# Patient Record
Sex: Male | Born: 1955 | Race: White | Hispanic: Yes | Marital: Married
Health system: Southern US, Community
[De-identification: ages and names within clinical notes are randomized; demographics above are authoritative.]

## PROBLEM LIST (undated history)

## (undated) DIAGNOSIS — J9621 Acute and chronic respiratory failure with hypoxia: Secondary | ICD-10-CM

## (undated) DIAGNOSIS — I6381 Other cerebral infarction due to occlusion or stenosis of small artery: Secondary | ICD-10-CM

## (undated) DIAGNOSIS — I482 Chronic atrial fibrillation, unspecified: Secondary | ICD-10-CM

## (undated) DIAGNOSIS — K7030 Alcoholic cirrhosis of liver without ascites: Secondary | ICD-10-CM

## (undated) DIAGNOSIS — I63219 Cerebral infarction due to unspecified occlusion or stenosis of unspecified vertebral arteries: Secondary | ICD-10-CM

---

## 2021-01-07 ENCOUNTER — Institutional Professional Consult (permissible substitution)
Admission: RE | Admit: 2021-01-07 | Discharge: 2021-02-03 | Disposition: A | Discharge status: Skilled Nursing Facility | Payer: Medicare Other | Attending: Internal Medicine | Admitting: Internal Medicine

## 2021-01-07 DIAGNOSIS — K567 Ileus, unspecified: Secondary | ICD-10-CM

## 2021-01-07 DIAGNOSIS — I482 Chronic atrial fibrillation, unspecified: Secondary | ICD-10-CM | POA: Diagnosis present

## 2021-01-07 DIAGNOSIS — J9690 Respiratory failure, unspecified, unspecified whether with hypoxia or hypercapnia: Secondary | ICD-10-CM

## 2021-01-07 DIAGNOSIS — I6322 Cerebral infarction due to unspecified occlusion or stenosis of basilar arteries: Secondary | ICD-10-CM | POA: Diagnosis present

## 2021-01-07 DIAGNOSIS — K7030 Alcoholic cirrhosis of liver without ascites: Secondary | ICD-10-CM | POA: Diagnosis present

## 2021-01-07 DIAGNOSIS — J9621 Acute and chronic respiratory failure with hypoxia: Secondary | ICD-10-CM | POA: Diagnosis present

## 2021-01-07 DIAGNOSIS — Z931 Gastrostomy status: Secondary | ICD-10-CM

## 2021-01-07 DIAGNOSIS — I6381 Other cerebral infarction due to occlusion or stenosis of small artery: Secondary | ICD-10-CM | POA: Diagnosis present

## 2021-01-07 DIAGNOSIS — J189 Pneumonia, unspecified organism: Secondary | ICD-10-CM

## 2021-01-07 DIAGNOSIS — R52 Pain, unspecified: Secondary | ICD-10-CM

## 2021-01-07 DIAGNOSIS — K668 Other specified disorders of peritoneum: Secondary | ICD-10-CM

## 2021-01-07 DIAGNOSIS — I63219 Cerebral infarction due to unspecified occlusion or stenosis of unspecified vertebral arteries: Secondary | ICD-10-CM | POA: Diagnosis present

## 2021-01-07 DIAGNOSIS — J939 Pneumothorax, unspecified: Secondary | ICD-10-CM

## 2021-01-07 HISTORY — DX: Alcoholic cirrhosis of liver without ascites: K70.30

## 2021-01-07 HISTORY — DX: Chronic atrial fibrillation, unspecified: I48.20

## 2021-01-07 HISTORY — DX: Cerebral infarction due to unspecified occlusion or stenosis of unspecified vertebral artery: I63.219

## 2021-01-07 HISTORY — DX: Other cerebral infarction due to occlusion or stenosis of small artery: I63.81

## 2021-01-07 HISTORY — DX: Acute and chronic respiratory failure with hypoxia: J96.21

## 2021-01-07 LAB — BLOOD GAS, ARTERIAL
Acid-Base Excess: 1 mmol/L (ref 0.0–2.0)
Bicarbonate: 24.8 mmol/L (ref 20.0–28.0)
FIO2: 30
O2 Saturation: 98.6 %
Patient temperature: 37.3
pCO2 arterial: 38.7 mmHg (ref 32.0–48.0)
pH, Arterial: 7.425 (ref 7.350–7.450)
pO2, Arterial: 128 mmHg — ABNORMAL HIGH (ref 83.0–108.0)

## 2021-01-08 ENCOUNTER — Other Ambulatory Visit (HOSPITAL_COMMUNITY): Payer: Medicare Other

## 2021-01-08 LAB — CBC
HCT: 32.1 % — ABNORMAL LOW (ref 39.0–52.0)
Hemoglobin: 11.3 g/dL — ABNORMAL LOW (ref 13.0–17.0)
MCH: 32.6 pg (ref 26.0–34.0)
MCHC: 35.2 g/dL (ref 30.0–36.0)
MCV: 92.5 fL (ref 80.0–100.0)
Platelets: 589 10*3/uL — ABNORMAL HIGH (ref 150–400)
RBC: 3.47 MIL/uL — ABNORMAL LOW (ref 4.22–5.81)
RDW: 12.2 % (ref 11.5–15.5)
WBC: 12.9 10*3/uL — ABNORMAL HIGH (ref 4.0–10.5)
nRBC: 0 % (ref 0.0–0.2)

## 2021-01-08 LAB — BASIC METABOLIC PANEL
Anion gap: 11 (ref 5–15)
BUN: 12 mg/dL (ref 8–23)
CO2: 23 mmol/L (ref 22–32)
Calcium: 8.5 mg/dL — ABNORMAL LOW (ref 8.9–10.3)
Chloride: 104 mmol/L (ref 98–111)
Creatinine, Ser: 0.64 mg/dL (ref 0.61–1.24)
GFR, Estimated: >60 mL/min (ref >60)
Glucose, Bld: 113 mg/dL — ABNORMAL HIGH (ref 70–99)
Potassium: 3.6 mmol/L (ref 3.5–5.1)
Sodium: 138 mmol/L (ref 135–145)

## 2021-01-08 MED ORDER — IOHEXOL 300 MG/ML  SOLN
50.0000 mL | Freq: Once | INTRAMUSCULAR | Status: AC | PRN
  Administered 2021-01-08: 50 mL

## 2021-01-09 ENCOUNTER — Other Ambulatory Visit (HOSPITAL_COMMUNITY): Payer: Medicare Other

## 2021-01-10 ENCOUNTER — Encounter: Payer: Self-pay | Admitting: Internal Medicine

## 2021-01-10 ENCOUNTER — Other Ambulatory Visit (HOSPITAL_COMMUNITY): Payer: Medicare Other

## 2021-01-10 DIAGNOSIS — K7030 Alcoholic cirrhosis of liver without ascites: Secondary | ICD-10-CM | POA: Diagnosis not present

## 2021-01-10 DIAGNOSIS — I482 Chronic atrial fibrillation, unspecified: Secondary | ICD-10-CM | POA: Diagnosis not present

## 2021-01-10 DIAGNOSIS — J9621 Acute and chronic respiratory failure with hypoxia: Secondary | ICD-10-CM | POA: Diagnosis present

## 2021-01-10 DIAGNOSIS — I63219 Cerebral infarction due to unspecified occlusion or stenosis of unspecified vertebral arteries: Secondary | ICD-10-CM

## 2021-01-10 DIAGNOSIS — I6381 Other cerebral infarction due to occlusion or stenosis of small artery: Secondary | ICD-10-CM | POA: Diagnosis present

## 2021-01-10 DIAGNOSIS — I6322 Cerebral infarction due to unspecified occlusion or stenosis of basilar arteries: Secondary | ICD-10-CM

## 2021-01-10 NOTE — Progress Notes (Signed)
Pulmonary Critical Care Medicine [facility] [place]   PULMONARY CRITICAL CARE SERVICE  PROGRESS NOTE  Date of Service: 01/10/2021  Paul Chung  ZGY:[id number]  DOB: 03-Sep-1956   DOA: 01/07/2021  Referring Physician: Carron Curie, MD  HPI: Paul Chung is a 65 y.o. male seen for follow up of Acute on Chronic Respiratory Failure.  Patient comfortable right now without distress has been on the weaning protocol  Medications: Reviewed on Rounds  Physical Exam:  Vitals: Temperature is 98.7 pulse 110 respiratory rate 25 blood pressure 134/66 saturations 100%  Ventilator Settings on pressure support waen  . General: Comfortable at this time . Eyes: Grossly normal lids, irises & conjunctiva . ENT: grossly tongue is normal . Neck: no obvious mass . Cardiovascular: S1 S2 normal no gallop . Respiratory: Scattered rhonchi coarse breath sound . Abdomen: soft . Skin: no rash seen on limited exam . Musculoskeletal: not rigid . Psychiatric:unable to assess . Neurologic: no seizure no involuntary movements         Lab Data:   Basic Metabolic Panel: Recent Labs  Lab 01/08/21 0438  NA 138  K 3.6  CL 104  CO2 23  GLUCOSE 113*  BUN 12  CREATININE 0.64  CALCIUM 8.5*    ABG: Recent Labs  Lab 01/07/21 2145  PHART 7.425  PCO2ART 38.7  PO2ART 128*  HCO3 24.8  O2SAT 98.6    Liver Function Tests: No results for input(s): AST, ALT, ALKPHOS, BILITOT, PROT, ALBUMIN in the last 168 hours. No results for input(s): LIPASE, AMYLASE in the last 168 hours. No results for input(s): AMMONIA in the last 168 hours.  CBC: Recent Labs  Lab 01/08/21 0438  WBC 12.9*  HGB 11.3*  HCT 32.1*  MCV 92.5  PLT 589*    Cardiac Enzymes: No results for input(s): CKTOTAL, CKMB, CKMBINDEX, TROPONINI in the last 168 hours.  BNP (last 3 results) No results for input(s): BNP in the last 8760 hours.  ProBNP (last 3 results) No results for input(s): PROBNP in  the last 8760 hours.  Radiological Exams: DG CHEST PORT 1 VIEW  Result Date: 01/10/2021 CLINICAL DATA:  Respiratory failure.  Pneumoperitoneum. EXAM: PORTABLE CHEST 1 VIEW COMPARISON:  None. FINDINGS: There is a tracheostomy tube in place. There are small bilateral pleural effusions. There is cardiomegaly with atherosclerotic changes of the thoracic aorta. There is no pneumothorax. There are old healed right-sided rib fractures. There is an old healed right clavicle fracture. IMPRESSION: 1. Small bilateral pleural effusions. 2. Cardiomegaly with atherosclerotic changes of the thoracic aorta. Electronically Signed   By: Katherine Mantle M.D.   On: 01/10/2021 06:48   DG Abd Decub  Result Date: 01/09/2021 CLINICAL DATA:  Evaluate known moderate free peritoneal air on recent CT scan 01/08/2021. Recent PEG tube placement 01/05/2021. EXAM: ABDOMEN - 1 VIEW DECUBITUS COMPARISON:  01/08/2021 and CT 01/08/2021 FINDINGS: Again noted is moderate free peritoneal air under the mid and right diaphragm. Moderate air distended stomach. Peg tube positioned over the stomach in the left upper quadrant. Bowel gas pattern demonstrates air throughout the colon. There are several air-filled nondilated small bowel loops. There are a few scattered air-fluid levels. Remainder of the exam is unchanged. IMPRESSION: 1. Nonobstructive bowel gas pattern with a few scattered air-fluid levels. 2. Moderate free peritoneal air unchanged from yesterday's CT 01/08/2021 and may be due to patient's recent PEG tube placement, although underlying bowel injury is still possible. 3. Percutaneous gastrostomy tube over the stomach in the left  upper quadrant. Moderate air distended stomach. Electronically Signed   By: Elberta Fortis M.D.   On: 01/09/2021 08:28   DG Abd Portable 1V  Result Date: 01/10/2021 CLINICAL DATA:  Pneumoperitoneum. EXAM: PORTABLE ABDOMEN - 1 VIEW COMPARISON:  January 09, 2021 FINDINGS: There is persistent pneumoperitoneum  in the right quadrant. The stomach is significantly distended. Again noted is a peg tube projecting over the stomach. The bowel gas pattern is nonobstructive. IMPRESSION: 1. Persistent pneumoperitoneum. 2. Distended stomach. Electronically Signed   By: Katherine Mantle M.D.   On: 01/10/2021 06:49    Assessment/Plan Active Problems:   Acute on chronic respiratory failure with hypoxia (HCC)   Acute arterial ischemic stroke, vertebrobasilar, thalamic, unspecified laterality (HCC)   Chronic atrial fibrillation with RVR (HCC)   Alcoholic cirrhosis of liver without ascites (HCC)   1. Acute on chronic respiratory failure hypoxia we will continue with the wean protocol as tolerated continue secretion management supportive care. 2. Acute stroke supportive care medical management with physical therapy 3. Chronic atrial fibrillation has been on anticoagulation no active bleed 4. Alcohol cirrhosis supportive care monitor cognitive status ammonia levels as needed   I have personally seen and evaluated the patient, evaluated laboratory and imaging results, formulated the assessment and plan and placed orders. The Patient requires high complexity decision making with multiple systems involvement.  Rounds were done with the Respiratory Therapy Director and Staff therapists and discussed with nursing staff also.  Yevonne Pax, MD [facility] Pulmonary Critical Care Medicine Sleep Medicine

## 2021-01-10 NOTE — Consult Note (Signed)
Pulmonary Critical Care Medicine [facility] [place]  PULMONARY SERVICE  Date of Service: 01/08/2021  PULMONARY CRITICAL CARE CONSULT   Paul Chung  SJG:[id number]  DOB: 06/28/1956   DOA: 01/07/2021  Referring Physician: Carron Curie, MD  HPI: Paul Chung is a 65 y.o. male seen for follow up of Acute on Chronic Respiratory Failure.  Patient has multiple medical problems including chronic atrial fibrillation diabetes mellitus hypertension alcoholic cirrhosis pancreatitis who presented to the hospital because of altered mental status.  Patient had been initially found to be unresponsive and found to be in rapid atrial fibrillation.  Patient was brought to the emergency department he was unresponsive intubated basically for airway protection CT scan initially that was done showed subtle hypodensity in the left thalamus extending into the midbrain.  Hospital course patient continued to have episodes of atrial fibrillation with rapid ventricular response and also had minimal response to.  By the 31st patient apparently was nodding to command and withdrawing extremities to pain.  January 3 to its noted that he was opening eyes but not following commands.  Less responsive at this time.  The patient was not extubated because of mental status.  By January 5 patient was extubated on high flow nasal cannula and by January 6 was not able to protect the airway so ended up having to be reintubated.  Because of the reintubation patient ended up having to have a tracheostomy done on January 10 patient also had a PEG tube placement on.  The patient was then started on T collar weans and subsequently transferred to our facility for further management  Review of Systems:  ROS performed and is unremarkable other than noted above.  Past Medical History:  Diagnosis Date  . Acute pancreatitis 07/03/2016  . Arrhythmia  . Atrial fibrillation (CMS/HCC) (HCC) 07/03/2016  . Coagulopathy  (CMS/HCC) (HCC) 07/03/2016  . Diabetes mellitus (CMS/HCC) (HCC) 07/03/2016  . Esophagitis  . Hypertension  . 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 Liver disorder  . Sepsis (CMS/HCC) 07/03/2016    Past Surgical History:  Procedure Laterality Date  . APPENDECTOMY  . HERNIA REPAIR    Family History  Problem Relation Age of Onset  . No Known Problems Mother  . No Known Problems Father  . Diabetes Sister  . No Known Problems Brother  . No Known Problems Brother  . No Known Problems Brother    Social History   Tobacco Use  . Smoking status: Former Games developer  . Smokeless tobacco: Never Used  Substance and Sexual Activity  . Alcohol use: No  Comment: past drinker for 40 years  . Drug use: No  . Sexual activity: Not on file       Medications: Reviewed on Rounds  Physical Exam:  Vitals: Temperature 99.6 pulse 117 respiratory 23 blood pressure is 1 3466 saturations 100%  Ventilator Settings on pressure support FiO2 30% pressure 12/5  . General: Comfortable at this time . Eyes: Grossly normal lids, irises & conjunctiva . ENT: grossly tongue is normal . Neck: no obvious mass . Cardiovascular: S1-S2 normal irregular rhythm . Respiratory: Scattered rhonchi expansion is equal . Abdomen: Soft and nontender . Skin: no rash seen on limited exam . Musculoskeletal: not rigid . Psychiatric:unable to assess . Neurologic: no seizure no involuntary movements         Labs on Admission:  Basic Metabolic Panel: Recent Labs  Lab 01/08/21 0438  NA 138  K 3.6  CL 104  CO2 23  GLUCOSE 113*  BUN 12  CREATININE 0.64  CALCIUM 8.5*    Recent Labs  Lab 01/07/21 2145  PHART 7.425  PCO2ART 38.7  PO2ART 128*  HCO3 24.8  O2SAT 98.6    Liver Function Tests: No results for input(s): AST, ALT, ALKPHOS, BILITOT, PROT, ALBUMIN in the last 168 hours. No results for input(s): LIPASE, AMYLASE in the last 168 hours. No results for input(s): AMMONIA in the last 168  hours.  CBC: Recent Labs  Lab 01/08/21 0438  WBC 12.9*  HGB 11.3*  HCT 32.1*  MCV 92.5  PLT 589*    Cardiac Enzymes: No results for input(s): CKTOTAL, CKMB, CKMBINDEX, TROPONINI in the last 168 hours.  BNP (last 3 results) No results for input(s): BNP in the last 8760 hours.  ProBNP (last 3 results) No results for input(s): PROBNP in the last 8760 hours.   Radiological Exams on Admission: CT ABDOMEN WO CONTRAST  Addendum Date: 01/08/2021   ADDENDUM REPORT: 01/08/2021 06:37 ADDENDUM: Study discussed by telephone with Dr. Brita Romp on 01/08/2021 at 06:36. She advised that the peg tube placement was 4 days ago, and that the patient is currently nontoxic. Therefore, favor this degree of pneumoperitoneum is postprocedural. We discussed follow-up with decubitus abdominal radiographs in the next day or two to document expected resolution of free air. Electronically Signed   By: Odessa Fleming M.D.   On: 01/08/2021 06:37   Result Date: 01/08/2021 CLINICAL DATA:  65 year old male with peg tube placement, questionable pneumoperitoneum on supine radiographs 0307 hours today. EXAM: CT ABDOMEN WITHOUT CONTRAST TECHNIQUE: Multidetector CT imaging of the abdomen was performed following the standard protocol without IV contrast. COMPARISON:  Radiographs 0307 hours today. FINDINGS: Lower chest: Mild cardiomegaly. Trace bilateral layering pleural effusions. Minor lung base atelectasis. Hepatobiliary: Moderate volume pneumoperitoneum in the upper abdomen, primarily along the liver contour and also at the superior cardiophrenic angle. Underlying nodular liver contour consistent with cirrhosis. No discrete liver lesion on this noncontrast exam. Contracted gallbladder. Pancreas: Negative noncontrast pancreas. Spleen: Upper limits of normal for size. Adrenals/Urinary Tract: Negative noncontrast adrenal glands, right kidney. Punctate right upper pole nephrolithiasis. Stomach/Bowel: Moderate volume of pneumoperitoneum,  confluent in the right upper quadrant and under the ventral abdominal wall, scattered in the left abdominal mesentery. Percutaneous gastrostomy tube in place (series 3, image 28) with moderately distended gas and contrast containing stomach. No gastric wall thickening or perigastric inflammatory stranding. The duodenum and proximal jejunum appear normal. Oral contrast is present in the mid jejunum. The transverse colon is located posterior and inferior to the stomach well away from the PEG tube site. No convincing large bowel wall thickening. Vascular/Lymphatic: Normal caliber abdominal aorta. Mild Aortoiliac calcified atherosclerosis. Vascular patency is not evaluated in the absence of IV contrast. No upper abdominal lymphadenopathy. Other: Trace if any abdominal free fluid, such as along the right hepatic contour. Musculoskeletal: No acute osseous abnormality identified. IMPRESSION: 1. Moderate volume free air in the abdomen. This could be an expected finding if the PEG tube was placed very recently. But should NOT be associated with a chronically indwelling gastrostomy tube - in which case tube malfunction would be favored over an acute bowel perforation. 2. Stomach is distended with air and some contrast. Administered oral contrast has reached the mid small bowel with no contrast extravasation visible. No evidence of mechanical bowel obstruction, no bowel inflammation identified. 3. Cirrhotic Liver. Cardiomegaly. Trace pleural effusions. Mild Calcified aortic atherosclerosis. Electronically Signed: By: Althea Grimmer.D.  On: 01/08/2021 05:53   DG ABDOMEN PEG TUBE LOCATION  Result Date: 01/08/2021 CLINICAL DATA:  Peg tube placement EXAM: ABDOMEN - 1 VIEW COMPARISON:  None FINDINGS: Injection of contrast through the pre-existing feeding tube opacifies the stomach. There is no extraluminal contrast. There is a questionable Rigler's sign involving multiple small bowel loops in the left lower quadrant. IMPRESSION: 1.  Injection of contrast opacifies the stomach. 2. Questionable pneumoperitoneum. Repeat radiographs with upright and cross-table lateral views are recommended. These results will be called to the ordering clinician or representative by the Radiologist Assistant, and communication documented in the PACS or [employer] Dashboard. Electronically Signed   By: Katherine Mantlehristopher  Green M.D.   On: 01/08/2021 03:27   DG CHEST PORT 1 VIEW  Result Date: 01/10/2021 CLINICAL DATA:  Respiratory failure.  Pneumoperitoneum. EXAM: PORTABLE CHEST 1 VIEW COMPARISON:  None. FINDINGS: There is a tracheostomy tube in place. There are small bilateral pleural effusions. There is cardiomegaly with atherosclerotic changes of the thoracic aorta. There is no pneumothorax. There are old healed right-sided rib fractures. There is an old healed right clavicle fracture. IMPRESSION: 1. Small bilateral pleural effusions. 2. Cardiomegaly with atherosclerotic changes of the thoracic aorta. Electronically Signed   By: Katherine Mantlehristopher  Green M.D.   On: 01/10/2021 06:48   DG Abd Decub  Result Date: 01/09/2021 CLINICAL DATA:  Evaluate known moderate free peritoneal air on recent CT scan 01/08/2021. Recent PEG tube placement 01/05/2021. EXAM: ABDOMEN - 1 VIEW DECUBITUS COMPARISON:  01/08/2021 and CT 01/08/2021 FINDINGS: Again noted is moderate free peritoneal air under the mid and right diaphragm. Moderate air distended stomach. Peg tube positioned over the stomach in the left upper quadrant. Bowel gas pattern demonstrates air throughout the colon. There are several air-filled nondilated small bowel loops. There are a few scattered air-fluid levels. Remainder of the exam is unchanged. IMPRESSION: 1. Nonobstructive bowel gas pattern with a few scattered air-fluid levels. 2. Moderate free peritoneal air unchanged from yesterday's CT 01/08/2021 and may be due to patient's recent PEG tube placement, although underlying bowel injury is still possible. 3. Percutaneous  gastrostomy tube over the stomach in the left upper quadrant. Moderate air distended stomach. Electronically Signed   By: Elberta Fortisaniel  Boyle M.D.   On: 01/09/2021 08:28   DG Abd Portable 1V  Result Date: 01/10/2021 CLINICAL DATA:  Pneumoperitoneum. EXAM: PORTABLE ABDOMEN - 1 VIEW COMPARISON:  January 09, 2021 FINDINGS: There is persistent pneumoperitoneum in the right quadrant. The stomach is significantly distended. Again noted is a peg tube projecting over the stomach. The bowel gas pattern is nonobstructive. IMPRESSION: 1. Persistent pneumoperitoneum. 2. Distended stomach. Electronically Signed   By: Katherine Mantlehristopher  Green M.D.   On: 01/10/2021 06:49    Assessment/Plan Active Problems:   Acute on chronic respiratory failure with hypoxia (HCC)   Acute arterial ischemic stroke, vertebrobasilar, thalamic, unspecified laterality (HCC)   Chronic atrial fibrillation with RVR (HCC)   Alcoholic cirrhosis of liver without ascites (HCC)   1. Acute on chronic respiratory failure with hypoxia patient will continue with weaning on pressure support as tolerated right now is on 12/5.  Based on the notes from the other facility it appears the patient was on T collar trial so we should be able to move towards that however on 13 January as noted the patient apparently failed because of intermittent episodes of apnea.  Stroke appeared to affect bilateral thalamus 2. Acute ischemic stroke felt to be secondary to the atrial fibrillation the patient mental status is waxing  and waning based on the notes that were able to see on discharge summary. 3. Chronic atrial fibrillation rapid ventricular response patient has been on Eliquis Plavix aspirin we will continue to monitor closely.  Patient has been on amiodarone for the atrial fibrillation. 4. Alcoholic cirrhosis we will continue to monitor patient will need ammonia is followed also.  Overall patient's prognosis remains guarded.  I have personally seen and evaluated the  patient, evaluated laboratory and imaging results, formulated the assessment and plan and placed orders. The Patient requires high complexity decision making with multiple systems involvement.  Case was discussed on Rounds with the Respiratory Therapy Director and the Respiratory staff Time Spent  Yevonne Pax, MD [facility] Pulmonary Critical Care Medicine Sleep Medicine

## 2021-01-11 DIAGNOSIS — I63219 Cerebral infarction due to unspecified occlusion or stenosis of unspecified vertebral arteries: Secondary | ICD-10-CM | POA: Diagnosis not present

## 2021-01-11 DIAGNOSIS — K7030 Alcoholic cirrhosis of liver without ascites: Secondary | ICD-10-CM | POA: Diagnosis not present

## 2021-01-11 DIAGNOSIS — I482 Chronic atrial fibrillation, unspecified: Secondary | ICD-10-CM | POA: Diagnosis not present

## 2021-01-11 DIAGNOSIS — J9621 Acute and chronic respiratory failure with hypoxia: Secondary | ICD-10-CM | POA: Diagnosis not present

## 2021-01-11 LAB — CBC
HCT: 31 % — ABNORMAL LOW (ref 39.0–52.0)
Hemoglobin: 11.1 g/dL — ABNORMAL LOW (ref 13.0–17.0)
MCH: 32.9 pg (ref 26.0–34.0)
MCHC: 35.8 g/dL (ref 30.0–36.0)
MCV: 92 fL (ref 80.0–100.0)
Platelets: 589 10*3/uL — ABNORMAL HIGH (ref 150–400)
RBC: 3.37 MIL/uL — ABNORMAL LOW (ref 4.22–5.81)
RDW: 12.3 % (ref 11.5–15.5)
WBC: 12.9 10*3/uL — ABNORMAL HIGH (ref 4.0–10.5)
nRBC: 0 % (ref 0.0–0.2)

## 2021-01-11 NOTE — Progress Notes (Signed)
Pulmonary Critical Care Medicine [facility] [place]   PULMONARY CRITICAL CARE SERVICE  PROGRESS NOTE  Date of Service: 01/11/2021  Paul Chung  [id number]  DOB: Feb 24, 1956   DOA: 01/07/2021  Referring Physician: Carron Curie, MD  HPI: Paul Chung is a 65 y.o. male seen for follow up of Acute on Chronic Respiratory Failure.  Patient currently is on pressure support has been on 28% FiO2 currently on a pressure of 12/5  Medications: Reviewed on Rounds  Physical Exam:  Vitals: Temperature is 98.7 pulse 69 respiratory rate 16 blood pressure 130/74 saturations 99%  Ventilator Settings on pressure support FiO2 28% pressure 12/5  . General: Comfortable at this time . Eyes: Grossly normal lids, irises & conjunctiva . ENT: grossly tongue is normal . Neck: no obvious mass . Cardiovascular: S1 S2 normal no gallop . Respiratory: No rhonchi very coarse breath sound . Abdomen: soft . Skin: no rash seen on limited exam . Musculoskeletal: not rigid . Psychiatric:unable to assess . Neurologic: no seizure no involuntary movements         Lab Data:   Basic Metabolic Panel: Recent Labs  Lab 01/08/21 0438  NA 138  K 3.6  CL 104  CO2 23  GLUCOSE 113*  BUN 12  CREATININE 0.64  CALCIUM 8.5*    ABG: Recent Labs  Lab 01/07/21 2145  PHART 7.425  PCO2ART 38.7  PO2ART 128*  HCO3 24.8  O2SAT 98.6    Liver Function Tests: No results for input(s): AST, ALT, ALKPHOS, BILITOT, PROT, ALBUMIN in the last 168 hours. No results for input(s): LIPASE, AMYLASE in the last 168 hours. No results for input(s): AMMONIA in the last 168 hours.  CBC: Recent Labs  Lab 01/08/21 0438 01/11/21 0453  WBC 12.9* 12.9*  HGB 11.3* 11.1*  HCT 32.1* 31.0*  MCV 92.5 92.0  PLT 589* 589*    Cardiac Enzymes: No results for input(s): CKTOTAL, CKMB, CKMBINDEX, TROPONINI in the last 168 hours.  BNP (last 3 results) No results for input(s): BNP in the last 8760  hours.  ProBNP (last 3 results) No results for input(s): PROBNP in the last 8760 hours.  Radiological Exams: DG CHEST PORT 1 VIEW  Result Date: 01/10/2021 CLINICAL DATA:  Respiratory failure.  Pneumoperitoneum. EXAM: PORTABLE CHEST 1 VIEW COMPARISON:  None. FINDINGS: There is a tracheostomy tube in place. There are small bilateral pleural effusions. There is cardiomegaly with atherosclerotic changes of the thoracic aorta. There is no pneumothorax. There are old healed right-sided rib fractures. There is an old healed right clavicle fracture. IMPRESSION: 1. Small bilateral pleural effusions. 2. Cardiomegaly with atherosclerotic changes of the thoracic aorta. Electronically Signed   By: Katherine Mantle M.D.   On: 01/10/2021 06:48   DG Abd Portable 1V  Result Date: 01/10/2021 CLINICAL DATA:  Pneumoperitoneum. EXAM: PORTABLE ABDOMEN - 1 VIEW COMPARISON:  January 09, 2021 FINDINGS: There is persistent pneumoperitoneum in the right quadrant. The stomach is significantly distended. Again noted is a peg tube projecting over the stomach. The bowel gas pattern is nonobstructive. IMPRESSION: 1. Persistent pneumoperitoneum. 2. Distended stomach. Electronically Signed   By: Katherine Mantle M.D.   On: 01/10/2021 06:49    Assessment/Plan Active Problems:   Acute on chronic respiratory failure with hypoxia (HCC)   Acute arterial ischemic stroke, vertebrobasilar, thalamic, unspecified laterality (HCC)   Chronic atrial fibrillation with RVR (HCC)   Alcoholic cirrhosis of liver without ascites (HCC)   1. Acute on chronic respiratory failure with hypoxia we  will continue with pressure support wean as tolerated. 2. Acute stroke no change supportive care 3. Chronic atrial fibrillation rate is controlled 4. Alcoholic cirrhosis we will continue with present management patient still had persistent cellular peritoneum   I have personally seen and evaluated the patient, evaluated laboratory and imaging  results, formulated the assessment and plan and placed orders. The Patient requires high complexity decision making with multiple systems involvement.  Rounds were done with the Respiratory Therapy Director and Staff therapists and discussed with nursing staff also.  Yevonne Pax, MD [facility] Pulmonary Critical Care Medicine Sleep Medicine

## 2021-01-12 DIAGNOSIS — K7030 Alcoholic cirrhosis of liver without ascites: Secondary | ICD-10-CM | POA: Diagnosis not present

## 2021-01-12 DIAGNOSIS — I63219 Cerebral infarction due to unspecified occlusion or stenosis of unspecified vertebral arteries: Secondary | ICD-10-CM | POA: Diagnosis not present

## 2021-01-12 DIAGNOSIS — I482 Chronic atrial fibrillation, unspecified: Secondary | ICD-10-CM | POA: Diagnosis not present

## 2021-01-12 DIAGNOSIS — J9621 Acute and chronic respiratory failure with hypoxia: Secondary | ICD-10-CM | POA: Diagnosis not present

## 2021-01-12 NOTE — Progress Notes (Signed)
Pulmonary Critical Care Medicine [facility] [place]   PULMONARY CRITICAL CARE SERVICE  PROGRESS NOTE  Date of Service: 01/12/2021  Paul Chung  [id number]  DOB: 04/06/1956   DOA: 01/07/2021  Referring Physician: Carron Curie, MD  HPI: Paul Chung is a 65 y.o. male seen for follow up of Acute on Chronic Respiratory Failure.  Patient currently is on T collar 28% FiO2  Medications: Reviewed on Rounds  Physical Exam:  Vitals: Temperature is 96.4 pulse 65 respiratory 18 blood pressure is 113/65 saturations 100%  Ventilator Settings on T collar with an FiO2 28%  . General: Comfortable at this time . Eyes: Grossly normal lids, irises & conjunctiva . ENT: grossly tongue is normal . Neck: no obvious mass . Cardiovascular: S1 S2 normal no gallop . Respiratory: No rhonchi coarse breath sounds . Abdomen: soft . Skin: no rash seen on limited exam . Musculoskeletal: not rigid . Psychiatric:unable to assess . Neurologic: no seizure no involuntary movements         Lab Data:   Basic Metabolic Panel: Recent Labs  Lab 01/08/21 0438  NA 138  K 3.6  CL 104  CO2 23  GLUCOSE 113*  BUN 12  CREATININE 0.64  CALCIUM 8.5*    ABG: Recent Labs  Lab 01/07/21 2145  PHART 7.425  PCO2ART 38.7  PO2ART 128*  HCO3 24.8  O2SAT 98.6    Liver Function Tests: No results for input(s): AST, ALT, ALKPHOS, BILITOT, PROT, ALBUMIN in the last 168 hours. No results for input(s): LIPASE, AMYLASE in the last 168 hours. No results for input(s): AMMONIA in the last 168 hours.  CBC: Recent Labs  Lab 01/08/21 0438 01/11/21 0453  WBC 12.9* 12.9*  HGB 11.3* 11.1*  HCT 32.1* 31.0*  MCV 92.5 92.0  PLT 589* 589*    Cardiac Enzymes: No results for input(s): CKTOTAL, CKMB, CKMBINDEX, TROPONINI in the last 168 hours.  BNP (last 3 results) No results for input(s): BNP in the last 8760 hours.  ProBNP (last 3 results) No results for input(s): PROBNP in  the last 8760 hours.  Radiological Exams: No results found.  Assessment/Plan Active Problems:   Acute on chronic respiratory failure with hypoxia (HCC)   Acute arterial ischemic stroke, vertebrobasilar, thalamic, unspecified laterality (HCC)   Chronic atrial fibrillation with RVR (HCC)   Alcoholic cirrhosis of liver without ascites (HCC)   1. Acute on chronic respiratory failure with hypoxia we will continue with T collar goal of 4 hours 2. Acute stroke no change we will continue to follow 3. Chronic atrial fibrillation rate is controlled 4. Alcoholic cirrhosis no change   I have personally seen and evaluated the patient, evaluated laboratory and imaging results, formulated the assessment and plan and placed orders. The Patient requires high complexity decision making with multiple systems involvement.  Rounds were done with the Respiratory Therapy Director and Staff therapists and discussed with nursing staff also.  Paul Pax, MD [facility] Pulmonary Critical Care Medicine Sleep Medicine

## 2021-01-13 ENCOUNTER — Other Ambulatory Visit (HOSPITAL_COMMUNITY): Payer: Medicare Other

## 2021-01-13 DIAGNOSIS — K7030 Alcoholic cirrhosis of liver without ascites: Secondary | ICD-10-CM | POA: Diagnosis not present

## 2021-01-13 DIAGNOSIS — I63219 Cerebral infarction due to unspecified occlusion or stenosis of unspecified vertebral arteries: Secondary | ICD-10-CM | POA: Diagnosis not present

## 2021-01-13 DIAGNOSIS — I482 Chronic atrial fibrillation, unspecified: Secondary | ICD-10-CM | POA: Diagnosis not present

## 2021-01-13 DIAGNOSIS — J9621 Acute and chronic respiratory failure with hypoxia: Secondary | ICD-10-CM | POA: Diagnosis not present

## 2021-01-13 LAB — MAGNESIUM: Magnesium: 1.9 mg/dL (ref 1.7–2.4)

## 2021-01-13 LAB — CBC
HCT: 32.9 % — ABNORMAL LOW (ref 39.0–52.0)
Hemoglobin: 11.9 g/dL — ABNORMAL LOW (ref 13.0–17.0)
MCH: 33.1 pg (ref 26.0–34.0)
MCHC: 36.2 g/dL — ABNORMAL HIGH (ref 30.0–36.0)
MCV: 91.4 fL (ref 80.0–100.0)
Platelets: 591 10*3/uL — ABNORMAL HIGH (ref 150–400)
RBC: 3.6 MIL/uL — ABNORMAL LOW (ref 4.22–5.81)
RDW: 12.6 % (ref 11.5–15.5)
WBC: 14.6 10*3/uL — ABNORMAL HIGH (ref 4.0–10.5)
nRBC: 0 % (ref 0.0–0.2)

## 2021-01-13 LAB — BASIC METABOLIC PANEL
Anion gap: 10 (ref 5–15)
BUN: 15 mg/dL (ref 8–23)
CO2: 25 mmol/L (ref 22–32)
Calcium: 8.8 mg/dL — ABNORMAL LOW (ref 8.9–10.3)
Chloride: 102 mmol/L (ref 98–111)
Creatinine, Ser: 0.63 mg/dL (ref 0.61–1.24)
GFR, Estimated: >60 mL/min (ref >60)
Glucose, Bld: 100 mg/dL — ABNORMAL HIGH (ref 70–99)
Potassium: 3.7 mmol/L (ref 3.5–5.1)
Sodium: 137 mmol/L (ref 135–145)

## 2021-01-13 NOTE — Progress Notes (Signed)
Pulmonary Critical Care Medicine [facility] [place]   PULMONARY CRITICAL CARE SERVICE  PROGRESS NOTE  Date of Service: 01/13/2021  Paul Chung  [id number]:[id number]  DOB: 1956-03-16   DOA: 01/07/2021  Referring Physician: Carron Curie, MD  HPI: Paul Chung is a 65 y.o. male seen for follow up of Acute on Chronic Respiratory Failure.  Patient is currently on T collar has been on 28% FiO2  Medications: Reviewed on Rounds  Physical Exam:  Vitals: Temperature is 97.1 pulse 63 respiratory rate is 18 blood pressure 108/67 saturations 99%  Ventilator Settings on T collar with an FiO2 of 28%  . General: Comfortable at this time . Eyes: Grossly normal lids, irises & conjunctiva . ENT: grossly tongue is normal . Neck: no obvious mass . Cardiovascular: S1 S2 normal no gallop . Respiratory: No rhonchi very coarse breath . Abdomen: soft . Skin: no rash seen on limited exam . Musculoskeletal: not rigid . Psychiatric:unable to assess . Neurologic: no seizure no involuntary movements         Lab Data:   Basic Metabolic Panel: Recent Labs  Lab 01/08/21 0438 01/13/21 0614  NA 138 137  K 3.6 3.7  CL 104 102  CO2 23 25  GLUCOSE 113* 100*  BUN 12 15  CREATININE 0.64 0.63  CALCIUM 8.5* 8.8*  MG  --  1.9    ABG: Recent Labs  Lab 01/07/21 2145  PHART 7.425  PCO2ART 38.7  PO2ART 128*  HCO3 24.8  O2SAT 98.6    Liver Function Tests: No results for input(s): AST, ALT, ALKPHOS, BILITOT, PROT, ALBUMIN in the last 168 hours. No results for input(s): LIPASE, AMYLASE in the last 168 hours. No results for input(s): AMMONIA in the last 168 hours.  CBC: Recent Labs  Lab 01/08/21 0438 01/11/21 0453 01/13/21 0614  WBC 12.9* 12.9* 14.6*  HGB 11.3* 11.1* 11.9*  HCT 32.1* 31.0* 32.9*  MCV 92.5 92.0 91.4  PLT 589* 589* 591*    Cardiac Enzymes: No results for input(s): CKTOTAL, CKMB, CKMBINDEX, TROPONINI in the last 168 hours.  BNP (last 3  results) No results for input(s): BNP in the last 8760 hours.  ProBNP (last 3 results) No results for input(s): PROBNP in the last 8760 hours.  Radiological Exams: DG Abd Portable 1V  Result Date: 01/13/2021 CLINICAL DATA:  Ileus. EXAM: PORTABLE ABDOMEN - 1 VIEW COMPARISON:  Chest x-ray and abdomen 01/10/2021.  CT 01/08/2021. FINDINGS: Upper abdomen is not imaged. Persistent free air may be present on accompanying chest x-ray. Previously identified gastrostomy tube not visualized. Gastric distention again most likely present. Progressive mild distention of small and large bowel. Findings suggest adynamic ileus. Continued follow-up exam suggested to demonstrate resolution or to exclude bowel obstruction. Degenerative changes lumbar spine, both SI joints, both hips. IMPRESSION: Upper abdomen is not imaged. Persistent free air may be present on accompanying chest x-ray. Previously identified gastrostomy tube not visualized. Gastric distention again most likely present. Progressive mild distention of small and large bowel. Findings suggest adynamic ileus. Continued follow-up exam suggested to demonstrate resolution or to exclude bowel obstruction. Electronically Signed   By: Maisie Fus  Register   On: 01/13/2021 06:42    Assessment/Plan Active Problems:   Acute on chronic respiratory failure with hypoxia (HCC)   Acute arterial ischemic stroke, vertebrobasilar, thalamic, unspecified laterality (HCC)   Chronic atrial fibrillation with RVR (HCC)   Alcoholic cirrhosis of liver without ascites (HCC)   1. Acute on chronic respiratory failure hypoxia we will  continue with T collar trials on 28% FiO2 secretions are still major issue 2. Acute stroke no change 3. Chronic atrial fibrillation rate is controlled 4. Alcoholic cirrhosis no change we will continue to follow   I have personally seen and evaluated the patient, evaluated laboratory and imaging results, formulated the assessment and plan and placed  orders. The Patient requires high complexity decision making with multiple systems involvement.  Rounds were done with the Respiratory Therapy Director and Staff therapists and discussed with nursing staff also.  Yevonne Pax, MD [facility] Pulmonary Critical Care Medicine Sleep Medicine

## 2021-01-14 DIAGNOSIS — I63219 Cerebral infarction due to unspecified occlusion or stenosis of unspecified vertebral arteries: Secondary | ICD-10-CM | POA: Diagnosis not present

## 2021-01-14 DIAGNOSIS — I482 Chronic atrial fibrillation, unspecified: Secondary | ICD-10-CM | POA: Diagnosis not present

## 2021-01-14 DIAGNOSIS — K7030 Alcoholic cirrhosis of liver without ascites: Secondary | ICD-10-CM | POA: Diagnosis not present

## 2021-01-14 DIAGNOSIS — J9621 Acute and chronic respiratory failure with hypoxia: Secondary | ICD-10-CM | POA: Diagnosis not present

## 2021-01-14 NOTE — Consult Note (Signed)
Referring Physician: Denzil Hughes, MD  Paul Chung is an 65 y.o. male.                       Chief Complaint: Bradycardia  HPI: 65 years old male with h/o acute on chronic respiratory failure, chronic atrial fibrillation with high risk of bleeding, LA appendage Watchman device placement, S/P COVID-19 infection 03/2020, type 2 DM, Hypertension, Alcoholic cirrhosis, Acute thalamic stroke pancreatitis, altered mental status, s/p tracheostomy, S/P PEG placement had episode of high grade AV block with heart rate in 40's/min.   Past Medical History:  Diagnosis Date  . Acute arterial ischemic stroke, vertebrobasilar, thalamic, unspecified laterality (HCC)   . Acute on chronic respiratory failure with hypoxia (HCC)   . Alcoholic cirrhosis of liver without ascites (HCC)   . Chronic atrial fibrillation with RVR (HCC)     Past Medical History:  Diagnosis Date  . Acute pancreatitis 07/03/2016  . Arrhythmia  . Atrial fibrillation (CMS/HCC) (HCC) 07/03/2016  . Coagulopathy (CMS/HCC) (HCC) 07/03/2016  . Diabetes mellitus (CMS/HCC) (HCC) 07/03/2016  . Esophagitis  . Hypertension  . 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 Liver disorder  . Sepsis (CMS/HCC) 07/03/2016    Past Surgical History:  Procedure Laterality Date  . APPENDECTOMY  . HERNIA REPAIR    Family History  Problem Relation Age of Onset  . No Known Problems Mother  . No Known Problems Father  . Diabetes Sister  . No Known Problems Brother  . No Known Problems Brother  . No Known Problems Brother    Social History   Tobacco Use  . Smoking status: Former Games developer  . Smokeless tobacco: Never Used  Substance and Sexual Activity  . Alcohol use: No  Comment: past drinker for 40 years  . Drug use: No  . Sexual activity: Not on file    No family history on file. Social History:  has history of tobacco use, alcohol use, and no drug use.  Allergies: Not on File  No medications prior to admission.   Amiodarone high dose and Metoprolol 75 mg. Bid.  Results for orders placed or performed during the hospital encounter of 01/07/21 (from the past 48 hour(s))  Basic metabolic panel     Status: Abnormal   Collection Time: 01/13/21  6:14 AM  Result Value Ref Range   Sodium 137 135 - 145 mmol/L   Potassium 3.7 3.5 - 5.1 mmol/L   Chloride 102 98 - 111 mmol/L   CO2 25 22 - 32 mmol/L   Glucose, Bld 100 (H) 70 - 99 mg/dL    Comment: Glucose reference range applies only to samples taken after fasting for at least 8 hours.   BUN 15 8 - 23 mg/dL   Creatinine, Ser 4.97 0.61 - 1.24 mg/dL   Calcium 8.8 (L) 8.9 - 10.3 mg/dL   GFR, Estimated >02 >63 mL/min    Comment: (NOTE) Calculated using the CKD-EPI Creatinine Equation (2021)    Anion gap 10 5 - 15    Comment: Performed at [facility], [address], [place], [place] [address]  CBC     Status: Abnormal   Collection Time: 01/13/21  6:14 AM  Result Value Ref Range   WBC 14.6 (H) 4.0 - 10.5 K/uL   RBC 3.60 (L) 4.22 - 5.81 MIL/uL   Hemoglobin 11.9 (L) 13.0 - 17.0 g/dL   HCT 50.2 (L) 77.4 - 12.8 %   MCV 91.4 80.0 -  100.0 fL   MCH 33.1 26.0 - 34.0 pg   MCHC 36.2 (H) 30.0 - 36.0 g/dL   RDW 26.9 48.5 - 46.2 %   Platelets 591 (H) 150 - 400 K/uL   nRBC 0.0 0.0 - 0.2 %    Comment: Performed at [facility], [address] [place], [place] [address]  Magnesium     Status: None   Collection Time: 01/13/21  6:14 AM  Result Value Ref Range   Magnesium 1.9 1.7 - 2.4 mg/dL    Comment: Performed at [facility], [address] [place], [place] [address]   DG Abd Portable 1V  Result Date: 01/13/2021 CLINICAL DATA:  Ileus. EXAM: PORTABLE ABDOMEN - 1 VIEW COMPARISON:  Chest x-ray and abdomen 01/10/2021.  CT 01/08/2021. FINDINGS: Upper abdomen is not imaged. Persistent free air may be present on accompanying chest x-ray. Previously identified gastrostomy tube not visualized. Gastric distention again most likely present. Progressive  mild distention of small and large bowel. Findings suggest adynamic ileus. Continued follow-up exam suggested to demonstrate resolution or to exclude bowel obstruction. Degenerative changes lumbar spine, both SI joints, both hips. IMPRESSION: Upper abdomen is not imaged. Persistent free air may be present on accompanying chest x-ray. Previously identified gastrostomy tube not visualized. Gastric distention again most likely present. Progressive mild distention of small and large bowel. Findings suggest adynamic ileus. Continued follow-up exam suggested to demonstrate resolution or to exclude bowel obstruction. Electronically Signed   By: Maisie Fus  Register   On: 01/13/2021 06:42    Review Of Systems As per HPI.  P: 50's R:18, BP: 130/50, O2 sat 100 % on 28 % FiO2 and pressure support. There were no vitals taken for this visit. There is no height or weight on file to calculate BMI. General appearance: awake. cooperative, appears stated age and mild respiratory distress Head: Normocephalic, atraumatic. Eyes: Brown eyes, pink conjunctiva, corneas clear. Neck: No adenopathy, no carotid bruit, no JVD, supple, symmetrical, trachea midline and thyroid not enlarged. Resp: Clearing to auscultation bilaterally. Cardio: Irregular rate and rhythm, S1, S2 normal, II/VI systolic murmur, no click, rub or gallop GI: Soft, non-tender; bowel sounds normal; no organomegaly. Extremities: No edema, cyanosis or clubbing. Skin: Warm and dry.  Neurologic: Alert and oriented X 1. Left sided weakness.  Assessment/Plan Acute on chronic respiratory failure with hypoxia Atrial fibrillation with high grade AV block, CHA2DS2VASc score of 5 Type 2 DM Hypertension S/P thalamic stroke S/P tracheostomy S/P PEG Alcoholic cirrhosis  Plan: Agree with holding amiodarone and Metoprolol today. Resume amiodarone tomorrow at 200 mg. daily. Resume metoprolol as needed for heart rate control.  Time spent: Review of old records,  Lab, x-rays, EKG, other cardiac tests, examination, discussion with patient/Nurse/Family member/Physician over 70 minutes.  Ricki Rodriguez, MD  01/14/2021, 5:44 PM

## 2021-01-14 NOTE — Progress Notes (Signed)
Pulmonary Critical Care Medicine [facility] [place]   PULMONARY CRITICAL CARE SERVICE  PROGRESS NOTE  Date of Service: 01/14/2021  Paul Chung  [id number]  DOB: 03-04-1956   DOA: 01/07/2021  Referring Physician: Carron Curie, MD  HPI: Paul Chung is a 65 y.o. male seen for follow up of Acute on Chronic Respiratory Failure.  Patient currently is on pressure support has been on 12/5  Medications: Reviewed on Rounds  Physical Exam:  Vitals: Temperature 96.4 pulse 57 respiratory rate 19 blood pressure is 86/48 saturations 100%  Ventilator Settings on pressure support FiO2 30%  . General: Comfortable at this time . Eyes: Grossly normal lids, irises & conjunctiva . ENT: grossly tongue is normal . Neck: no obvious mass . Cardiovascular: S1 S2 normal no gallop . Respiratory: No rhonchi coarse breath sounds . Abdomen: soft . Skin: no rash seen on limited exam . Musculoskeletal: not rigid . Psychiatric:unable to assess . Neurologic: no seizure no involuntary movements         Lab Data:   Basic Metabolic Panel: Recent Labs  Lab 01/08/21 0438 01/13/21 0614  NA 138 137  K 3.6 3.7  CL 104 102  CO2 23 25  GLUCOSE 113* 100*  BUN 12 15  CREATININE 0.64 0.63  CALCIUM 8.5* 8.8*  MG  --  1.9    ABG: Recent Labs  Lab 01/07/21 2145  PHART 7.425  PCO2ART 38.7  PO2ART 128*  HCO3 24.8  O2SAT 98.6    Liver Function Tests: No results for input(s): AST, ALT, ALKPHOS, BILITOT, PROT, ALBUMIN in the last 168 hours. No results for input(s): LIPASE, AMYLASE in the last 168 hours. No results for input(s): AMMONIA in the last 168 hours.  CBC: Recent Labs  Lab 01/08/21 0438 01/11/21 0453 01/13/21 0614  WBC 12.9* 12.9* 14.6*  HGB 11.3* 11.1* 11.9*  HCT 32.1* 31.0* 32.9*  MCV 92.5 92.0 91.4  PLT 589* 589* 591*    Cardiac Enzymes: No results for input(s): CKTOTAL, CKMB, CKMBINDEX, TROPONINI in the last 168 hours.  BNP (last 3  results) No results for input(s): BNP in the last 8760 hours.  ProBNP (last 3 results) No results for input(s): PROBNP in the last 8760 hours.  Radiological Exams: DG Abd Portable 1V  Result Date: 01/13/2021 CLINICAL DATA:  Ileus. EXAM: PORTABLE ABDOMEN - 1 VIEW COMPARISON:  Chest x-ray and abdomen 01/10/2021.  CT 01/08/2021. FINDINGS: Upper abdomen is not imaged. Persistent free air may be present on accompanying chest x-ray. Previously identified gastrostomy tube not visualized. Gastric distention again most likely present. Progressive mild distention of small and large bowel. Findings suggest adynamic ileus. Continued follow-up exam suggested to demonstrate resolution or to exclude bowel obstruction. Degenerative changes lumbar spine, both SI joints, both hips. IMPRESSION: Upper abdomen is not imaged. Persistent free air may be present on accompanying chest x-ray. Previously identified gastrostomy tube not visualized. Gastric distention again most likely present. Progressive mild distention of small and large bowel. Findings suggest adynamic ileus. Continued follow-up exam suggested to demonstrate resolution or to exclude bowel obstruction. Electronically Signed   By: Maisie Fus  Register   On: 01/13/2021 06:42    Assessment/Plan Active Problems:   Acute on chronic respiratory failure with hypoxia (HCC)   Acute arterial ischemic stroke, vertebrobasilar, thalamic, unspecified laterality (HCC)   Chronic atrial fibrillation with RVR (HCC)   Alcoholic cirrhosis of liver without ascites (HCC)   1. Acute on chronic respiratory failure with hypoxia we will continue with pressure support  titrate oxygen continue pulmonary toilet. 2. Acute stroke no change 3. Chronic atrial fibrillation rate controlled 4. Alcoholic cirrhosis patient is at baseline   I have personally seen and evaluated the patient, evaluated laboratory and imaging results, formulated the assessment and plan and placed orders. The  Patient requires high complexity decision making with multiple systems involvement.  Rounds were done with the Respiratory Therapy Director and Staff therapists and discussed with nursing staff also.  Yevonne Pax, MD [facility] Pulmonary Critical Care Medicine Sleep Medicine

## 2021-01-15 ENCOUNTER — Other Ambulatory Visit (HOSPITAL_COMMUNITY): Payer: Medicare Other

## 2021-01-15 DIAGNOSIS — K7030 Alcoholic cirrhosis of liver without ascites: Secondary | ICD-10-CM | POA: Diagnosis not present

## 2021-01-15 DIAGNOSIS — J9621 Acute and chronic respiratory failure with hypoxia: Secondary | ICD-10-CM | POA: Diagnosis not present

## 2021-01-15 DIAGNOSIS — I482 Chronic atrial fibrillation, unspecified: Secondary | ICD-10-CM | POA: Diagnosis not present

## 2021-01-15 DIAGNOSIS — I63219 Cerebral infarction due to unspecified occlusion or stenosis of unspecified vertebral arteries: Secondary | ICD-10-CM | POA: Diagnosis not present

## 2021-01-15 LAB — COMPREHENSIVE METABOLIC PANEL
ALT: 65 U/L — ABNORMAL HIGH (ref 0–44)
AST: 93 U/L — ABNORMAL HIGH (ref 15–41)
Albumin: 2.7 g/dL — ABNORMAL LOW (ref 3.5–5.0)
Alkaline Phosphatase: 274 U/L — ABNORMAL HIGH (ref 38–126)
Anion gap: 12 (ref 5–15)
BUN: 17 mg/dL (ref 8–23)
CO2: 21 mmol/L — ABNORMAL LOW (ref 22–32)
Calcium: 8.7 mg/dL — ABNORMAL LOW (ref 8.9–10.3)
Chloride: 107 mmol/L (ref 98–111)
Creatinine, Ser: 0.64 mg/dL (ref 0.61–1.24)
GFR, Estimated: >60 mL/min (ref >60)
Glucose, Bld: 121 mg/dL — ABNORMAL HIGH (ref 70–99)
Potassium: 4.2 mmol/L (ref 3.5–5.1)
Sodium: 140 mmol/L (ref 135–145)
Total Bilirubin: 1.7 mg/dL — ABNORMAL HIGH (ref 0.3–1.2)
Total Protein: 7.1 g/dL (ref 6.5–8.1)

## 2021-01-15 LAB — LIPASE, BLOOD: Lipase: 40 U/L (ref 11–51)

## 2021-01-15 LAB — HEMOGLOBIN A1C
Hgb A1c MFr Bld: 6.5 % — ABNORMAL HIGH (ref 4.8–5.6)
Mean Plasma Glucose: 139.85 mg/dL

## 2021-01-15 NOTE — Progress Notes (Signed)
Pulmonary Critical Care Medicine [facility] [place]   PULMONARY CRITICAL CARE SERVICE  PROGRESS NOTE  Date of Service: 01/15/2021  Paul Chung  [id number]  DOB: Jan 28, 1956   DOA: 01/07/2021  Referring Physician: Carron Curie, MD  HPI: Paul Chung is a 65 y.o. male seen for follow up of Acute on Chronic Respiratory Failure.  Patient at this time is comfortable without distress remains on pressure support mode  Medications: Reviewed on Rounds  Physical Exam:  Vitals: Temperature 97.4 pulse 71 respiratory rate is 30 blood pressure is 145/87 saturations 97%  Ventilator Settings on pressure support FiO2 28% pressure 12.5  . General: Comfortable at this time . Eyes: Grossly normal lids, irises & conjunctiva . ENT: grossly tongue is normal . Neck: no obvious mass . Cardiovascular: S1 S2 normal no gallop . Respiratory: Scattered rhonchi expansion equal . Abdomen: soft . Skin: no rash seen on limited exam . Musculoskeletal: not rigid . Psychiatric:unable to assess . Neurologic: no seizure no involuntary movements         Lab Data:   Basic Metabolic Panel: Recent Labs  Lab 01/13/21 0614  NA 137  K 3.7  CL 102  CO2 25  GLUCOSE 100*  BUN 15  CREATININE 0.63  CALCIUM 8.8*  MG 1.9    ABG: No results for input(s): PHART, PCO2ART, PO2ART, HCO3, O2SAT in the last 168 hours.  Liver Function Tests: No results for input(s): AST, ALT, ALKPHOS, BILITOT, PROT, ALBUMIN in the last 168 hours. No results for input(s): LIPASE, AMYLASE in the last 168 hours. No results for input(s): AMMONIA in the last 168 hours.  CBC: Recent Labs  Lab 01/11/21 0453 01/13/21 0614  WBC 12.9* 14.6*  HGB 11.1* 11.9*  HCT 31.0* 32.9*  MCV 92.0 91.4  PLT 589* 591*    Cardiac Enzymes: No results for input(s): CKTOTAL, CKMB, CKMBINDEX, TROPONINI in the last 168 hours.  BNP (last 3 results) No results for input(s): BNP in the last 8760 hours.  ProBNP  (last 3 results) No results for input(s): PROBNP in the last 8760 hours.  Radiological Exams: DG CHEST PORT 1 VIEW  Result Date: 01/15/2021 CLINICAL DATA:  Pain. EXAM: PORTABLE CHEST 1 VIEW COMPARISON:  Five days ago FINDINGS: Enlarged heart size with unremarkable mediastinal contours. A left atrial occluder is noted. Tracheostomy tube in place. Artifact from EKG leads. There is no edema, consolidation, effusion, or pneumothorax. Remote mid right clavicle fracture which is healed. IMPRESSION: No evidence of active disease. Electronically Signed   By: Marnee Spring M.D.   On: 01/15/2021 07:47   DG Abd Portable 1V  Result Date: 01/15/2021 CLINICAL DATA:  Abdominal pain EXAM: PORTABLE ABDOMEN - 1 VIEW COMPARISON:  Two days ago FINDINGS: Gaseous distention of the stomach. A percutaneous gastrostomy tube is present. Bowel gas pattern is overall nonobstructive. Lung bases as described on dedicated chest x-ray. No visible pneumoperitoneum. IMPRESSION: Moderate gaseous distention of the stomach. No evidence of small or large bowel obstruction. Electronically Signed   By: Marnee Spring M.D.   On: 01/15/2021 07:46    Assessment/Plan Active Problems:   Acute on chronic respiratory failure with hypoxia (HCC)   Acute arterial ischemic stroke, vertebrobasilar, thalamic, unspecified laterality (HCC)   Chronic atrial fibrillation with RVR (HCC)   Alcoholic cirrhosis of liver without ascites (HCC)   1. Acute on chronic respiratory failure with hypoxia we will continue with pressure support titrate oxygen as tolerated continue pulmonary toilet. 2. Acute stroke no change supportive  care 3. Chronic atrial fibrillation rate controlled cardiology follow-up 4. Alcoholic cirrhosis patient is at baseline   I have personally seen and evaluated the patient, evaluated laboratory and imaging results, formulated the assessment and plan and placed orders. The Patient requires high complexity decision making with  multiple systems involvement.  Rounds were done with the Respiratory Therapy Director and Staff therapists and discussed with nursing staff also.  Yevonne Pax, MD [facility] Pulmonary Critical Care Medicine Sleep Medicine

## 2021-01-15 NOTE — Consult Note (Signed)
Ref: Pcp, No Dr. Manson Passey   Subjective:  Improving heart rate, atrial fibrillation continues. Increased anxiety last night with Klonopin hold.  Objective:  Vital Signs in the last 24 hours: BP: 130'/60's, P: 70, R: 18. O2 sat 100 %.   Physical Exam: BP Readings from Last 1 Encounters:  No data found for BP     Wt Readings from Last 1 Encounters:  No data found for Wt    Weight change:  There is no height or weight on file to calculate BMI. HEENT: Amber/AT, Eyes-Brown, Conjunctiva-Pink, Sclera-Non-icteric Neck: No JVD, No bruit, Trachea midline. Lungs:  Clearing, Bilateral. Cardiac:  Irregular rhythm, normal S1 and S2, no S3. II/VI systolic murmur. Abdomen:  Soft, non-tender. BS present. Extremities:  No edema present. No cyanosis. No clubbing. CNS: AxOx1, Cranial nerves grossly intact, moves all 4 extremities. Left sided weakness Skin: Warm and dry.   Intake/Output from previous day: No intake/output data recorded.    Lab Results: BMET    Component Value Date/Time   NA 137 01/13/2021 0614   NA 138 01/08/2021 0438   K 3.7 01/13/2021 0614   K 3.6 01/08/2021 0438   CL 102 01/13/2021 0614   CL 104 01/08/2021 0438   CO2 25 01/13/2021 0614   CO2 23 01/08/2021 0438   GLUCOSE 100 (H) 01/13/2021 0614   GLUCOSE 113 (H) 01/08/2021 0438   BUN 15 01/13/2021 0614   BUN 12 01/08/2021 0438   CREATININE 0.63 01/13/2021 0614   CREATININE 0.64 01/08/2021 0438   CALCIUM 8.8 (L) 01/13/2021 0614   CALCIUM 8.5 (L) 01/08/2021 0438   GFRNONAA >60 01/13/2021 0614   GFRNONAA >60 01/08/2021 0438   CBC    Component Value Date/Time   WBC 14.6 (H) 01/13/2021 0614   RBC 3.60 (L) 01/13/2021 0614   HGB 11.9 (L) 01/13/2021 0614   HCT 32.9 (L) 01/13/2021 0614   PLT 591 (H) 01/13/2021 0614   MCV 91.4 01/13/2021 0614   MCH 33.1 01/13/2021 0614   MCHC 36.2 (H) 01/13/2021 0614   RDW 12.6 01/13/2021 0614   HEPATIC Function Panel No results for input(s): PROT in the last 8760 hours.  Invalid  input(s):  ALBUMIN,  AST,  ALT,  ALKPHOS,  BILIDIR,  IBILI HEMOGLOBIN A1C No components found for: HGA1C,  MPG CARDIAC ENZYMES No results found for: CKTOTAL, CKMB, CKMBINDEX, TROPONINI BNP No results for input(s): PROBNP in the last 8760 hours. TSH No results for input(s): TSH in the last 8760 hours. CHOLESTEROL No results for input(s): CHOL in the last 8760 hours.  Scheduled Meds: Continuous Infusions: PRN Meds:.  Assessment/Plan: Acute on chronic respiratory failure with hypoxia Atrial fibrillation, chronic Type 2 DM Hypertension S/P thalamic stroke S/P tracheostomy S/P PEG Alcoholic cirrhosis  Plan Resume low dose Beta blocker along with average dose amiodarone. Resume Clonazepam as needed.   LOS: 0 days   Time spent including chart review, lab review, examination, discussion with patient/Nurse : 30 min   Orpah Cobb  MD  01/15/2021, 9:44 AM

## 2021-01-16 DIAGNOSIS — I63219 Cerebral infarction due to unspecified occlusion or stenosis of unspecified vertebral arteries: Secondary | ICD-10-CM | POA: Diagnosis not present

## 2021-01-16 DIAGNOSIS — K7030 Alcoholic cirrhosis of liver without ascites: Secondary | ICD-10-CM | POA: Diagnosis not present

## 2021-01-16 DIAGNOSIS — J9621 Acute and chronic respiratory failure with hypoxia: Secondary | ICD-10-CM | POA: Diagnosis not present

## 2021-01-16 DIAGNOSIS — I482 Chronic atrial fibrillation, unspecified: Secondary | ICD-10-CM | POA: Diagnosis not present

## 2021-01-16 NOTE — Progress Notes (Signed)
Pulmonary Critical Care Medicine [facility] [place]   PULMONARY CRITICAL CARE SERVICE  PROGRESS NOTE  Date of Service: 01/16/2021  Kavon Valenza  WGY:[id number]  DOB: Sep 25, 1956   DOA: 01/07/2021  Referring Physician: Carron Curie, MD  HPI: Nai Dasch is a 65 y.o. male seen for follow up of Acute on Chronic Respiratory Failure.  Patient did about 6 hours of pressure support yesterday today will be restarted right now is resting on assist control  Medications: Reviewed on Rounds  Physical Exam:  Vitals: Temperature is 98.2 pulse 82 respiratory rate is 13 blood pressure is 110/37 saturations 99%  Ventilator Settings on assist control FiO2 28% tidal volume 450 PEEP5  . General: Comfortable at this time . Eyes: Grossly normal lids, irises & conjunctiva . ENT: grossly tongue is normal . Neck: no obvious mass . Cardiovascular: S1 S2 normal no gallop . Respiratory: Coarse breath sounds with few scattered rhonchi . Abdomen: soft . Skin: no rash seen on limited exam . Musculoskeletal: not rigid . Psychiatric:unable to assess . Neurologic: no seizure no involuntary movements         Lab Data:   Basic Metabolic Panel: Recent Labs  Lab 01/13/21 0614 01/15/21 1147  NA 137 140  K 3.7 4.2  CL 102 107  CO2 25 21*  GLUCOSE 100* 121*  BUN 15 17  CREATININE 0.63 0.64  CALCIUM 8.8* 8.7*  MG 1.9  --     ABG: No results for input(s): PHART, PCO2ART, PO2ART, HCO3, O2SAT in the last 168 hours.  Liver Function Tests: Recent Labs  Lab 01/15/21 1147  AST 93*  ALT 65*  ALKPHOS 274*  BILITOT 1.7*  PROT 7.1  ALBUMIN 2.7*   Recent Labs  Lab 01/15/21 1147  LIPASE 40   No results for input(s): AMMONIA in the last 168 hours.  CBC: Recent Labs  Lab 01/11/21 0453 01/13/21 0614  WBC 12.9* 14.6*  HGB 11.1* 11.9*  HCT 31.0* 32.9*  MCV 92.0 91.4  PLT 589* 591*    Cardiac Enzymes: No results for input(s): CKTOTAL, CKMB, CKMBINDEX,  TROPONINI in the last 168 hours.  BNP (last 3 results) No results for input(s): BNP in the last 8760 hours.  ProBNP (last 3 results) No results for input(s): PROBNP in the last 8760 hours.  Radiological Exams: DG CHEST PORT 1 VIEW  Result Date: 01/15/2021 CLINICAL DATA:  Pain. EXAM: PORTABLE CHEST 1 VIEW COMPARISON:  Five days ago FINDINGS: Enlarged heart size with unremarkable mediastinal contours. A left atrial occluder is noted. Tracheostomy tube in place. Artifact from EKG leads. There is no edema, consolidation, effusion, or pneumothorax. Remote mid right clavicle fracture which is healed. IMPRESSION: No evidence of active disease. Electronically Signed   By: Marnee Spring M.D.   On: 01/15/2021 07:47   DG Abd Portable 1V  Result Date: 01/15/2021 CLINICAL DATA:  Abdominal pain EXAM: PORTABLE ABDOMEN - 1 VIEW COMPARISON:  Two days ago FINDINGS: Gaseous distention of the stomach. A percutaneous gastrostomy tube is present. Bowel gas pattern is overall nonobstructive. Lung bases as described on dedicated chest x-ray. No visible pneumoperitoneum. IMPRESSION: Moderate gaseous distention of the stomach. No evidence of small or large bowel obstruction. Electronically Signed   By: Marnee Spring M.D.   On: 01/15/2021 07:46    Assessment/Plan Active Problems:   Acute on chronic respiratory failure with hypoxia (HCC)   Acute arterial ischemic stroke, vertebrobasilar, thalamic, unspecified laterality (HCC)   Chronic atrial fibrillation with RVR (HCC)  Alcoholic cirrhosis of liver without ascites (HCC)   1. Acute on chronic respiratory failure hypoxia we will continue with advancing the wean as tolerated today's goal will be up to 8 to 12 hours on pressure support 2. Acute stroke no change we will continue to monitor closely. 3. Chronic atrial fibrillation rate is controlled 4. Alcoholic cirrhosis no change   I have personally seen and evaluated the patient, evaluated laboratory and  imaging results, formulated the assessment and plan and placed orders. The Patient requires high complexity decision making with multiple systems involvement.  Rounds were done with the Respiratory Therapy Director and Staff therapists and discussed with nursing staff also.  Yevonne Pax, MD [facility] Pulmonary Critical Care Medicine Sleep Medicine

## 2021-01-16 NOTE — Consult Note (Signed)
Ref: Pcp, No   Subjective:  Heart rate in 50's at night.  VS stable today. Heart rate in 60-70's per minute.  Objective:  Vital Signs in the last 24 hours: P: 70, R: 18 BP: 130/70, O2 sat 100 % on 28 % FiO2.   Physical Exam: BP Readings from Last 1 Encounters:  No data found for BP     Wt Readings from Last 1 Encounters:  No data found for Wt    Weight change:  There is no height or weight on file to calculate BMI. HEENT: [place]/AT, Eyes-Brown, Conjunctiva-Pink, Sclera-Non-icteric Neck: No JVD, No bruit, Tracheostomy in place. Lungs:  Clearing, Bilateral. Cardiac:  Regular rhythm, normal S1 and S2, no S3. II/VI systolic murmur. Abdomen:  Soft, non-tender. BS present. Extremities:  No edema present. No cyanosis. No clubbing. CNS: AxOx1, Cranial nerves grossly intact, moves all 4 extremities. Left sided weakness. Skin: Warm and dry.   Intake/Output from previous day: No intake/output data recorded.    Lab Results: BMET    Component Value Date/Time   NA 140 01/15/2021 1147   NA 137 01/13/2021 0614   NA 138 01/08/2021 0438   K 4.2 01/15/2021 1147   K 3.7 01/13/2021 0614   K 3.6 01/08/2021 0438   CL 107 01/15/2021 1147   CL 102 01/13/2021 0614   CL 104 01/08/2021 0438   CO2 21 (L) 01/15/2021 1147   CO2 25 01/13/2021 0614   CO2 23 01/08/2021 0438   GLUCOSE 121 (H) 01/15/2021 1147   GLUCOSE 100 (H) 01/13/2021 0614   GLUCOSE 113 (H) 01/08/2021 0438   BUN 17 01/15/2021 1147   BUN 15 01/13/2021 0614   BUN 12 01/08/2021 0438   CREATININE 0.64 01/15/2021 1147   CREATININE 0.63 01/13/2021 0614   CREATININE 0.64 01/08/2021 0438   CALCIUM 8.7 (L) 01/15/2021 1147   CALCIUM 8.8 (L) 01/13/2021 0614   CALCIUM 8.5 (L) 01/08/2021 0438   GFRNONAA >60 01/15/2021 1147   GFRNONAA >60 01/13/2021 0614   GFRNONAA >60 01/08/2021 0438   CBC    Component Value Date/Time   WBC 14.6 (H) 01/13/2021 0614   RBC 3.60 (L) 01/13/2021 0614   HGB 11.9 (L) 01/13/2021 0614   HCT 32.9 (L)  01/13/2021 0614   PLT 591 (H) 01/13/2021 0614   MCV 91.4 01/13/2021 0614   MCH 33.1 01/13/2021 0614   MCHC 36.2 (H) 01/13/2021 0614   RDW 12.6 01/13/2021 0614   HEPATIC Function Panel Recent Labs    01/15/21 1147  PROT 7.1   HEMOGLOBIN A1C No components found for: HGA1C,  MPG CARDIAC ENZYMES No results found for: CKTOTAL, CKMB, CKMBINDEX, TROPONINI BNP No results for input(s): PROBNP in the last 8760 hours. TSH No results for input(s): TSH in the last 8760 hours. CHOLESTEROL No results for input(s): CHOL in the last 8760 hours.  Scheduled Meds: Continuous Infusions: PRN Meds:.  Assessment/Plan: Acute on chronic respiratory failure with hypoxia Atrial fibrillation, chronic HTN Type 2 DM S/P thalamic stroke S/P tracheostomy S/P PEG Alcoholic cirrhosis  Decrease metoprolol to 25 mg. 2 times daily.  Continue weaning as tolerated.   LOS: 0 days   Time spent including chart review, lab review, examination, discussion with patient/Nurse/Physician : 30 min   Orpah Cobb  MD  01/16/2021, 11:01 AM

## 2021-01-17 ENCOUNTER — Other Ambulatory Visit (HOSPITAL_COMMUNITY): Payer: Medicare Other

## 2021-01-17 DIAGNOSIS — J9621 Acute and chronic respiratory failure with hypoxia: Secondary | ICD-10-CM | POA: Diagnosis not present

## 2021-01-17 DIAGNOSIS — I482 Chronic atrial fibrillation, unspecified: Secondary | ICD-10-CM | POA: Diagnosis not present

## 2021-01-17 DIAGNOSIS — K7030 Alcoholic cirrhosis of liver without ascites: Secondary | ICD-10-CM | POA: Diagnosis not present

## 2021-01-17 DIAGNOSIS — I63219 Cerebral infarction due to unspecified occlusion or stenosis of unspecified vertebral arteries: Secondary | ICD-10-CM | POA: Diagnosis not present

## 2021-01-17 LAB — URINALYSIS, ROUTINE W REFLEX MICROSCOPIC
Bacteria, UA: NONE SEEN
Bilirubin Urine: NEGATIVE
Glucose, UA: NEGATIVE mg/dL
Hgb urine dipstick: NEGATIVE
Ketones, ur: 5 mg/dL — AB
Leukocytes,Ua: NEGATIVE
Nitrite: NEGATIVE
Protein, ur: 100 mg/dL — AB
Specific Gravity, Urine: 1.026 (ref 1.005–1.030)
pH: 7 (ref 5.0–8.0)

## 2021-01-17 LAB — SARS CORONAVIRUS 2 (TAT 6-24 HRS): SARS Coronavirus 2: NEGATIVE

## 2021-01-17 NOTE — Progress Notes (Signed)
Pulmonary Critical Care Medicine [facility] [place]   PULMONARY CRITICAL CARE SERVICE  PROGRESS NOTE  Date of Service: 01/17/2021  Paul Chung  LXB:[id number]  DOB: Jun 13, 1956   DOA: 01/07/2021  Referring Physician: Carron Curie, MD  HPI: Paul Chung is a 65 y.o. male seen for follow up of Acute on Chronic Respiratory Failure.  Patient's been on pressure support weaning trial T collar today  Medications: Reviewed on Rounds  Physical Exam:  Vitals: Temperature is 98.7 pulse 99 respiratory rate 24 blood pressure is 138/80 saturations 98%  Ventilator Settings on pressure support FiO2 is 28% pressure 12/5  . General: Comfortable at this time . Eyes: Grossly normal lids, irises & conjunctiva . ENT: grossly tongue is normal . Neck: no obvious mass . Cardiovascular: S1 S2 normal no gallop . Respiratory: Scattered rhonchi coarse breath . Abdomen: soft . Skin: no rash seen on limited exam . Musculoskeletal: not rigid . Psychiatric:unable to assess . Neurologic: no seizure no involuntary movements         Lab Data:   Basic Metabolic Panel: Recent Labs  Lab 01/13/21 0614 01/15/21 1147  NA 137 140  K 3.7 4.2  CL 102 107  CO2 25 21*  GLUCOSE 100* 121*  BUN 15 17  CREATININE 0.63 0.64  CALCIUM 8.8* 8.7*  MG 1.9  --     ABG: No results for input(s): PHART, PCO2ART, PO2ART, HCO3, O2SAT in the last 168 hours.  Liver Function Tests: Recent Labs  Lab 01/15/21 1147  AST 93*  ALT 65*  ALKPHOS 274*  BILITOT 1.7*  PROT 7.1  ALBUMIN 2.7*   Recent Labs  Lab 01/15/21 1147  LIPASE 40   No results for input(s): AMMONIA in the last 168 hours.  CBC: Recent Labs  Lab 01/11/21 0453 01/13/21 0614  WBC 12.9* 14.6*  HGB 11.1* 11.9*  HCT 31.0* 32.9*  MCV 92.0 91.4  PLT 589* 591*    Cardiac Enzymes: No results for input(s): CKTOTAL, CKMB, CKMBINDEX, TROPONINI in the last 168 hours.  BNP (last 3 results) No results for input(s):  BNP in the last 8760 hours.  ProBNP (last 3 results) No results for input(s): PROBNP in the last 8760 hours.  Radiological Exams: No results found.  Assessment/Plan Active Problems:   Acute on chronic respiratory failure with hypoxia (HCC)   Acute arterial ischemic stroke, vertebrobasilar, thalamic, unspecified laterality (HCC)   Chronic atrial fibrillation with RVR (HCC)   Alcoholic cirrhosis of liver without ascites (HCC)   1. Acute on chronic respiratory failure hypoxia we will continue with the pressure support and try T collar today 2. Acute stroke no change supportive care 3. Chronic atrial fibrillation rate is controlled 4. 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   I have personally seen and evaluated the patient, evaluated laboratory and imaging results, formulated the assessment and plan and placed orders. The Patient requires high complexity decision making with multiple systems involvement.  Rounds were done with the Respiratory Therapy Director and Staff therapists and discussed with nursing staff also.  Yevonne Pax, MD [facility] Pulmonary Critical Care Medicine Sleep Medicine

## 2021-01-18 DIAGNOSIS — K7030 Alcoholic cirrhosis of liver without ascites: Secondary | ICD-10-CM | POA: Diagnosis not present

## 2021-01-18 DIAGNOSIS — I482 Chronic atrial fibrillation, unspecified: Secondary | ICD-10-CM | POA: Diagnosis not present

## 2021-01-18 DIAGNOSIS — J9621 Acute and chronic respiratory failure with hypoxia: Secondary | ICD-10-CM | POA: Diagnosis not present

## 2021-01-18 DIAGNOSIS — I63219 Cerebral infarction due to unspecified occlusion or stenosis of unspecified vertebral arteries: Secondary | ICD-10-CM | POA: Diagnosis not present

## 2021-01-18 LAB — CBC WITH DIFFERENTIAL/PLATELET
Abs Immature Granulocytes: 0.12 10*3/uL — ABNORMAL HIGH (ref 0.00–0.07)
Basophils Absolute: 0.1 10*3/uL (ref 0.0–0.1)
Basophils Relative: 0 %
Eosinophils Absolute: 0.3 10*3/uL (ref 0.0–0.5)
Eosinophils Relative: 1 %
HCT: 35.7 % — ABNORMAL LOW (ref 39.0–52.0)
Hemoglobin: 12.3 g/dL — ABNORMAL LOW (ref 13.0–17.0)
Immature Granulocytes: 1 %
Lymphocytes Relative: 9 %
Lymphs Abs: 1.7 10*3/uL (ref 0.7–4.0)
MCH: 32.6 pg (ref 26.0–34.0)
MCHC: 34.5 g/dL (ref 30.0–36.0)
MCV: 94.7 fL (ref 80.0–100.0)
Monocytes Absolute: 0.8 10*3/uL (ref 0.1–1.0)
Monocytes Relative: 4 %
Neutro Abs: 16.9 10*3/uL — ABNORMAL HIGH (ref 1.7–7.7)
Neutrophils Relative %: 85 %
Platelets: 372 10*3/uL (ref 150–400)
RBC: 3.77 MIL/uL — ABNORMAL LOW (ref 4.22–5.81)
RDW: 13.2 % (ref 11.5–15.5)
WBC: 19.9 10*3/uL — ABNORMAL HIGH (ref 4.0–10.5)
nRBC: 0 % (ref 0.0–0.2)

## 2021-01-18 LAB — BASIC METABOLIC PANEL
Anion gap: 9 (ref 5–15)
BUN: 24 mg/dL — ABNORMAL HIGH (ref 8–23)
CO2: 26 mmol/L (ref 22–32)
Calcium: 9 mg/dL (ref 8.9–10.3)
Chloride: 107 mmol/L (ref 98–111)
Creatinine, Ser: 0.7 mg/dL (ref 0.61–1.24)
GFR, Estimated: >60 mL/min (ref >60)
Glucose, Bld: 172 mg/dL — ABNORMAL HIGH (ref 70–99)
Potassium: 3.4 mmol/L — ABNORMAL LOW (ref 3.5–5.1)
Sodium: 142 mmol/L (ref 135–145)

## 2021-01-18 NOTE — Progress Notes (Signed)
Pulmonary Critical Care Medicine [facility] [place]   PULMONARY CRITICAL CARE SERVICE  PROGRESS NOTE  Date of Service: 01/18/2021  Paul Chung  OJJ:[id number]  DOB: 1956/05/21   DOA: 01/07/2021  Referring Physician: Carron Curie, MD  HPI: Paul Chung is a 65 y.o. male seen for follow up of Acute on Chronic Respiratory Failure.  Patient is on the ventilator right now patient has been weaning.  Currently the goal is for 8-hour pressure support  Medications: Reviewed on Rounds  Physical Exam:  Vitals: Temperature is 99.6 pulse 89 respiratory rate 20 blood pressure is 127/72 saturations 97%  Ventilator Settings on assist control FiO2 is 28% tidal volume 450 PEEP of 5  . General: Comfortable at this time . Eyes: Grossly normal lids, irises & conjunctiva . ENT: grossly tongue is normal . Neck: no obvious mass . Cardiovascular: S1 S2 normal no gallop . Respiratory: No rhonchi very coarse breath sounds . Abdomen: soft . Skin: no rash seen on limited exam . Musculoskeletal: not rigid . Psychiatric:unable to assess . Neurologic: no seizure no involuntary movements         Lab Data:   Basic Metabolic Panel: Recent Labs  Lab 01/13/21 0614 01/15/21 1147  NA 137 140  K 3.7 4.2  CL 102 107  CO2 25 21*  GLUCOSE 100* 121*  BUN 15 17  CREATININE 0.63 0.64  CALCIUM 8.8* 8.7*  MG 1.9  --     ABG: No results for input(s): PHART, PCO2ART, PO2ART, HCO3, O2SAT in the last 168 hours.  Liver Function Tests: Recent Labs  Lab 01/15/21 1147  AST 93*  ALT 65*  ALKPHOS 274*  BILITOT 1.7*  PROT 7.1  ALBUMIN 2.7*   Recent Labs  Lab 01/15/21 1147  LIPASE 40   No results for input(s): AMMONIA in the last 168 hours.  CBC: Recent Labs  Lab 01/13/21 0614  WBC 14.6*  HGB 11.9*  HCT 32.9*  MCV 91.4  PLT 591*    Cardiac Enzymes: No results for input(s): CKTOTAL, CKMB, CKMBINDEX, TROPONINI in the last 168 hours.  BNP (last 3  results) No results for input(s): BNP in the last 8760 hours.  ProBNP (last 3 results) No results for input(s): PROBNP in the last 8760 hours.  Radiological Exams: DG CHEST PORT 1 VIEW  Result Date: 01/17/2021 CLINICAL DATA:  Reported history of pneumonia in this 65 year old male. EXAM: PORTABLE CHEST 1 VIEW COMPARISON:  January 15, 2021 FINDINGS: Heart size remains enlarged and is stable accounting for degree of rotation to the RIGHT on the current study. Tracheostomy tube remains in place. Lungs are clear. No sign of effusion. Distended stomach partially visualized in LEFT upper quadrant. On limited assessment no acute skeletal process. IMPRESSION: 1. Stable cardiomegaly. 2. No acute cardiopulmonary disease with tracheostomy tube in Electronically Signed   By: Donzetta Kohut M.D.   On: 01/17/2021 14:02    Assessment/Plan Active Problems:   Acute on chronic respiratory failure with hypoxia (HCC)   Acute arterial ischemic stroke, vertebrobasilar, thalamic, unspecified laterality (HCC)   Chronic atrial fibrillation with RVR (HCC)   Alcoholic cirrhosis of liver without ascites (HCC)   1. Acute on chronic respiratory failure hypoxia plan is to continue with the weaning protocol do 8 hours of pressure support 2. Acute stroke no change continue to monitor 3. Chronic atrial fibrillation rate is controlled 4. Cirrhosis no change   I have personally seen and evaluated the patient, evaluated laboratory and imaging results, formulated the  assessment and plan and placed orders. The Patient requires high complexity decision making with multiple systems involvement.  Rounds were done with the Respiratory Therapy Director and Staff therapists and discussed with nursing staff also.  Allyne Gee, MD [facility] Pulmonary Critical Care Medicine Sleep Medicine

## 2021-01-19 LAB — CBC
HCT: 34.4 % — ABNORMAL LOW (ref 39.0–52.0)
Hemoglobin: 11.8 g/dL — ABNORMAL LOW (ref 13.0–17.0)
MCH: 32.9 pg (ref 26.0–34.0)
MCHC: 34.3 g/dL (ref 30.0–36.0)
MCV: 95.8 fL (ref 80.0–100.0)
Platelets: 313 10*3/uL (ref 150–400)
RBC: 3.59 MIL/uL — ABNORMAL LOW (ref 4.22–5.81)
RDW: 13.1 % (ref 11.5–15.5)
WBC: 15.8 10*3/uL — ABNORMAL HIGH (ref 4.0–10.5)
nRBC: 0 % (ref 0.0–0.2)

## 2021-01-19 LAB — BASIC METABOLIC PANEL
Anion gap: 12 (ref 5–15)
BUN: 21 mg/dL (ref 8–23)
CO2: 24 mmol/L (ref 22–32)
Calcium: 9 mg/dL (ref 8.9–10.3)
Chloride: 106 mmol/L (ref 98–111)
Creatinine, Ser: 0.71 mg/dL (ref 0.61–1.24)
GFR, Estimated: >60 mL/min (ref >60)
Glucose, Bld: 192 mg/dL — ABNORMAL HIGH (ref 70–99)
Potassium: 3.4 mmol/L — ABNORMAL LOW (ref 3.5–5.1)
Sodium: 142 mmol/L (ref 135–145)

## 2021-01-20 DIAGNOSIS — I63219 Cerebral infarction due to unspecified occlusion or stenosis of unspecified vertebral arteries: Secondary | ICD-10-CM | POA: Diagnosis not present

## 2021-01-20 DIAGNOSIS — K7030 Alcoholic cirrhosis of liver without ascites: Secondary | ICD-10-CM | POA: Diagnosis not present

## 2021-01-20 DIAGNOSIS — I482 Chronic atrial fibrillation, unspecified: Secondary | ICD-10-CM | POA: Diagnosis not present

## 2021-01-20 DIAGNOSIS — J9621 Acute and chronic respiratory failure with hypoxia: Secondary | ICD-10-CM | POA: Diagnosis not present

## 2021-01-20 LAB — CULTURE, RESPIRATORY W GRAM STAIN

## 2021-01-20 LAB — POTASSIUM: Potassium: 3.5 mmol/L (ref 3.5–5.1)

## 2021-01-20 NOTE — Progress Notes (Signed)
Pulmonary Critical Care Medicine [facility] [place]   PULMONARY CRITICAL CARE SERVICE  PROGRESS NOTE  Date of Service: 01/20/2021  Paul Chung  FOY:[id number]  DOB: Jun 30, 1956   DOA: 01/07/2021  Referring Physician: Carron Curie, MD  HPI: Paul Chung is a 65 y.o. male seen for follow up of Acute on Chronic Respiratory Failure.  Patient currently is on pressure support the goal is to try for 16 hours  Medications: Reviewed on Rounds  Physical Exam:  Vitals: Temperature is 98.1 pulse 69 respiratory rate 22 blood pressure is 111/71 saturations 99%  Ventilator Settings on pressure support FiO2 28% pressure 12/5   General: Comfortable at this time  Eyes: Grossly normal lids, irises & conjunctiva  ENT: grossly tongue is normal  Neck: no obvious mass  Cardiovascular: S1 S2 normal no gallop  Respiratory: Coarse rhonchi expansion is equal  Abdomen: soft  Skin: no rash seen on limited exam  Musculoskeletal: not rigid  Psychiatric:unable to assess  Neurologic: no seizure no involuntary movements         Lab Data:   Basic Metabolic Panel: Recent Labs  Lab 01/15/21 1147 01/18/21 1151 01/19/21 0428  NA 140 142 142  K 4.2 3.4* 3.4*  CL 107 107 106  CO2 21* 26 24  GLUCOSE 121* 172* 192*  BUN 17 24* 21  CREATININE 0.64 0.70 0.71  CALCIUM 8.7* 9.0 9.0    ABG: No results for input(s): PHART, PCO2ART, PO2ART, HCO3, O2SAT in the last 168 hours.  Liver Function Tests: Recent Labs  Lab 01/15/21 1147  AST 93*  ALT 65*  ALKPHOS 274*  BILITOT 1.7*  PROT 7.1  ALBUMIN 2.7*   Recent Labs  Lab 01/15/21 1147  LIPASE 40   No results for input(s): AMMONIA in the last 168 hours.  CBC: Recent Labs  Lab 01/18/21 1151 01/19/21 0428  WBC 19.9* 15.8*  NEUTROABS 16.9*  --   HGB 12.3* 11.8*  HCT 35.7* 34.4*  MCV 94.7 95.8  PLT 372 313    Cardiac Enzymes: No results for input(s): CKTOTAL, CKMB, CKMBINDEX, TROPONINI in the  last 168 hours.  BNP (last 3 results) No results for input(s): BNP in the last 8760 hours.  ProBNP (last 3 results) No results for input(s): PROBNP in the last 8760 hours.  Radiological Exams: No results found.  Assessment/Plan Active Problems:   Acute on chronic respiratory failure with hypoxia (HCC)   Acute arterial ischemic stroke, vertebrobasilar, thalamic, unspecified laterality (HCC)   Chronic atrial fibrillation with RVR (HCC)   Alcoholic cirrhosis of liver without ascites (HCC)   1. Acute on chronic respiratory failure hypoxia we will continue to wean on pressure support as ordered.  Right now is on a pressure of 12/5 2. Acute stroke no change we will continue with supportive care 3. Chronic atrial fibrillation rate is controlled 4. 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 The Patient requires high complexity decision making with multiple systems involvement.  Rounds were done with the Respiratory Therapy Director and Staff therapists and discussed with nursing staff also.  Yevonne Pax, MD [facility] Pulmonary Critical Care Medicine Sleep Medicine

## 2021-01-21 DIAGNOSIS — K7030 Alcoholic cirrhosis of liver without ascites: Secondary | ICD-10-CM | POA: Diagnosis not present

## 2021-01-21 DIAGNOSIS — I482 Chronic atrial fibrillation, unspecified: Secondary | ICD-10-CM | POA: Diagnosis not present

## 2021-01-21 DIAGNOSIS — I63219 Cerebral infarction due to unspecified occlusion or stenosis of unspecified vertebral arteries: Secondary | ICD-10-CM | POA: Diagnosis not present

## 2021-01-21 DIAGNOSIS — J9621 Acute and chronic respiratory failure with hypoxia: Secondary | ICD-10-CM | POA: Diagnosis not present

## 2021-01-21 NOTE — Progress Notes (Signed)
Pulmonary Critical Care Medicine [facility] [place]   PULMONARY CRITICAL CARE SERVICE  PROGRESS NOTE  Date of Service: 01/21/2021  Paul Chung  ELF:[id number]  DOB: [birth year]   DOA: 01/07/2021  Referring Physician: Carron Curie, MD  HPI: Paul Chung is a 65 y.o. male seen for follow up of Acute on Chronic Respiratory Failure.  Patient currently is on T collar off the ventilator with going to try for up to 20 hours  Medications: Reviewed on Rounds  Physical Exam:  Vitals: Temperature 97.3 pulse 85 respiratory 26 blood pressure is 132/72 saturations 99%  Ventilator Settings on T collar FiO2 28%  . General: Comfortable at this time . Eyes: Grossly normal lids, irises & conjunctiva . ENT: grossly tongue is normal . Neck: no obvious mass . Cardiovascular: S1 S2 normal no gallop . Respiratory: No rhonchi coarse breath sound . Abdomen: soft . Skin: no rash seen on limited exam . Musculoskeletal: not rigid . Psychiatric:unable to assess . Neurologic: no seizure no involuntary movements         Lab Data:   Basic Metabolic Panel: Recent Labs  Lab 01/15/21 1147 01/18/21 1151 01/19/21 0428 01/20/21 1408  NA 140 142 142  --   K 4.2 3.4* 3.4* 3.5  CL 107 107 106  --   CO2 21* 26 24  --   GLUCOSE 121* 172* 192*  --   BUN 17 24* 21  --   CREATININE 0.64 0.70 0.71  --   CALCIUM 8.7* 9.0 9.0  --     ABG: No results for input(s): PHART, PCO2ART, PO2ART, HCO3, O2SAT in the last 168 hours.  Liver Function Tests: Recent Labs  Lab 01/15/21 1147  AST 93*  ALT 65*  ALKPHOS 274*  BILITOT 1.7*  PROT 7.1  ALBUMIN 2.7*   Recent Labs  Lab 01/15/21 1147  LIPASE 40   No results for input(s): AMMONIA in the last 168 hours.  CBC: Recent Labs  Lab 01/18/21 1151 01/19/21 0428  WBC 19.9* 15.8*  NEUTROABS 16.9*  --   HGB 12.3* 11.8*  HCT 35.7* 34.4*  MCV 94.7 95.8  PLT 372 313    Cardiac Enzymes: No results for input(s): CKTOTAL,  CKMB, CKMBINDEX, TROPONINI in the last 168 hours.  BNP (last 3 results) No results for input(s): BNP in the last 8760 hours.  ProBNP (last 3 results) No results for input(s): PROBNP in the last 8760 hours.  Radiological Exams: No results found.  Assessment/Plan Active Problems:   Acute on chronic respiratory failure with hypoxia (HCC)   Acute arterial ischemic stroke, vertebrobasilar, thalamic, unspecified laterality (HCC)   Chronic atrial fibrillation with RVR (HCC)   Alcoholic cirrhosis of liver without ascites (HCC)   1. Acute on chronic respiratory failure hypoxia we will continue with T collar trials 20-hour goal 2. Acute stroke no change patient is at baseline 3. Chronic atrial fibrillation rate controlled 4. 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 The Patient requires high complexity decision making with multiple systems involvement.  Rounds were done with the Respiratory Therapy Director and Staff therapists and discussed with nursing staff also.  Yevonne Pax, MD [facility] Pulmonary Critical Care Medicine Sleep Medicine

## 2021-01-22 DIAGNOSIS — I482 Chronic atrial fibrillation, unspecified: Secondary | ICD-10-CM | POA: Diagnosis not present

## 2021-01-22 DIAGNOSIS — I63219 Cerebral infarction due to unspecified occlusion or stenosis of unspecified vertebral arteries: Secondary | ICD-10-CM | POA: Diagnosis not present

## 2021-01-22 DIAGNOSIS — J9621 Acute and chronic respiratory failure with hypoxia: Secondary | ICD-10-CM | POA: Diagnosis not present

## 2021-01-22 DIAGNOSIS — K7030 Alcoholic cirrhosis of liver without ascites: Secondary | ICD-10-CM | POA: Diagnosis not present

## 2021-01-22 LAB — CULTURE, BLOOD (ROUTINE X 2)
Culture: NO GROWTH
Culture: NO GROWTH
Special Requests: ADEQUATE

## 2021-01-22 NOTE — Progress Notes (Signed)
Pulmonary Critical Care Medicine [facility] [place]   PULMONARY CRITICAL CARE SERVICE  PROGRESS NOTE  Date of Service: 01/22/2021  Paul Chung  MEQ:[id number]  DOB: [birth year]   DOA: 01/07/2021  Referring Physician: Carron Curie, MD  HPI: Paul Chung is a 65 y.o. male seen for follow up of Acute on Chronic Respiratory Failure.  Patient currently is off the ventilator on T collar today will be completing 24 hours.  Medications: Reviewed on Rounds  Physical Exam:  Vitals: Temperature is 96.7 pulse 81 respiratory 16 blood pressure is 133/84 saturations 96%  Ventilator Settings on T collar currently on 28% FiO2  . General: Comfortable at this time . Eyes: Grossly normal lids, irises & conjunctiva . ENT: grossly tongue is normal . Neck: no obvious mass . Cardiovascular: S1 S2 normal no gallop . Respiratory: Scattered rhonchi .  Marland Kitchen Abdomen: soft . Skin: no rash seen on limited exam . Musculoskeletal: not rigid . Psychiatric:unable to assess . Neurologic: no seizure no involuntary movements         Lab Data:   Basic Metabolic Panel: Recent Labs  Lab 01/15/21 1147 01/18/21 1151 01/19/21 0428 01/20/21 1408  NA 140 142 142  --   K 4.2 3.4* 3.4* 3.5  CL 107 107 106  --   CO2 21* 26 24  --   GLUCOSE 121* 172* 192*  --   BUN 17 24* 21  --   CREATININE 0.64 0.70 0.71  --   CALCIUM 8.7* 9.0 9.0  --     ABG: No results for input(s): PHART, PCO2ART, PO2ART, HCO3, O2SAT in the last 168 hours.  Liver Function Tests: Recent Labs  Lab 01/15/21 1147  AST 93*  ALT 65*  ALKPHOS 274*  BILITOT 1.7*  PROT 7.1  ALBUMIN 2.7*   Recent Labs  Lab 01/15/21 1147  LIPASE 40   No results for input(s): AMMONIA in the last 168 hours.  CBC: Recent Labs  Lab 01/18/21 1151 01/19/21 0428  WBC 19.9* 15.8*  NEUTROABS 16.9*  --   HGB 12.3* 11.8*  HCT 35.7* 34.4*  MCV 94.7 95.8  PLT 372 313    Cardiac Enzymes: No results for input(s):  CKTOTAL, CKMB, CKMBINDEX, TROPONINI in the last 168 hours.  BNP (last 3 results) No results for input(s): BNP in the last 8760 hours.  ProBNP (last 3 results) No results for input(s): PROBNP in the last 8760 hours.  Radiological Exams: No results found.  Assessment/Plan Active Problems:   Acute on chronic respiratory failure with hypoxia (HCC)   Acute arterial ischemic stroke, vertebrobasilar, thalamic, unspecified laterality (HCC)   Chronic atrial fibrillation with RVR (HCC)   Alcoholic cirrhosis of liver without ascites (HCC)   1. Acute on chronic respiratory failure hypoxia we will continue with the T collar titrate oxygen continue pulmonary toilet. 2. Acute stroke no change continue supportive care. 3. Chronic atrial fibrillation rate is controlled 4. 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 The Patient requires high complexity decision making with multiple systems involvement.  Rounds were done with the Respiratory Therapy Director and Staff therapists and discussed with nursing staff also.  Yevonne Pax, MD [facility] Pulmonary Critical Care Medicine Sleep Medicine

## 2021-01-23 DIAGNOSIS — J9621 Acute and chronic respiratory failure with hypoxia: Secondary | ICD-10-CM | POA: Diagnosis not present

## 2021-01-23 DIAGNOSIS — K7030 Alcoholic cirrhosis of liver without ascites: Secondary | ICD-10-CM | POA: Diagnosis not present

## 2021-01-23 DIAGNOSIS — I482 Chronic atrial fibrillation, unspecified: Secondary | ICD-10-CM | POA: Diagnosis not present

## 2021-01-23 DIAGNOSIS — I63219 Cerebral infarction due to unspecified occlusion or stenosis of unspecified vertebral arteries: Secondary | ICD-10-CM | POA: Diagnosis not present

## 2021-01-23 LAB — C DIFFICILE (CDIFF) QUICK SCRN (NO PCR REFLEX)
C Diff antigen: NEGATIVE
C Diff interpretation: NOT DETECTED
C Diff toxin: NEGATIVE

## 2021-01-23 NOTE — Progress Notes (Signed)
Pulmonary Critical Care Medicine [facility] [place]   PULMONARY CRITICAL CARE SERVICE  PROGRESS NOTE  Date of Service: 01/23/2021  Paul Chung  JGG:[id number]  DOB: [birth year]   DOA: 01/07/2021  Referring Physician: Carron Curie, MD  HPI: Paul Chung is a 65 y.o. male seen for follow up of Acute on Chronic Respiratory Failure.  Patient currently is on T collar has been on 28% FiO2 goal today will be 48-hour  Medications: Reviewed on Rounds  Physical Exam:  Vitals: Temperature is 98.1 pulse 76 respiratory 29 blood pressure is 127/87 saturations 99%  Ventilator Settings off the ventilator on T collar  . General: Comfortable at this time . Eyes: Grossly normal lids, irises & conjunctiva . ENT: grossly tongue is normal . Neck: no obvious mass . Cardiovascular: S1 S2 normal no gallop . Respiratory: No rhonchi coarse breath sounds . Abdomen: soft . Skin: no rash seen on limited exam . Musculoskeletal: not rigid . Psychiatric:unable to assess . Neurologic: no seizure no involuntary movements         Lab Data:   Basic Metabolic Panel: Recent Labs  Lab 01/18/21 1151 01/19/21 0428 01/20/21 1408  NA 142 142  --   K 3.4* 3.4* 3.5  CL 107 106  --   CO2 26 24  --   GLUCOSE 172* 192*  --   BUN 24* 21  --   CREATININE 0.70 0.71  --   CALCIUM 9.0 9.0  --     ABG: No results for input(s): PHART, PCO2ART, PO2ART, HCO3, O2SAT in the last 168 hours.  Liver Function Tests: No results for input(s): AST, ALT, ALKPHOS, BILITOT, PROT, ALBUMIN in the last 168 hours. No results for input(s): LIPASE, AMYLASE in the last 168 hours. No results for input(s): AMMONIA in the last 168 hours.  CBC: Recent Labs  Lab 01/18/21 1151 01/19/21 0428  WBC 19.9* 15.8*  NEUTROABS 16.9*  --   HGB 12.3* 11.8*  HCT 35.7* 34.4*  MCV 94.7 95.8  PLT 372 313    Cardiac Enzymes: No results for input(s): CKTOTAL, CKMB, CKMBINDEX, TROPONINI in the last 168  hours.  BNP (last 3 results) No results for input(s): BNP in the last 8760 hours.  ProBNP (last 3 results) No results for input(s): PROBNP in the last 8760 hours.  Radiological Exams: No results found.  Assessment/Plan Active Problems:   Acute on chronic respiratory failure with hypoxia (HCC)   Acute arterial ischemic stroke, vertebrobasilar, thalamic, unspecified laterality (HCC)   Chronic atrial fibrillation with RVR (HCC)   Alcoholic cirrhosis of liver without ascites (HCC)   1. Acute on chronic respiratory failure hypoxia continue with T-piece titrate oxygen continue pulmonary toilet 2. Acute stroke no change continue with supportive care 3. Chronic atrial fibrillation rate is controlled 4. 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 The Patient requires high complexity decision making with multiple systems involvement.  Rounds were done with the Respiratory Therapy Director and Staff therapists and discussed with nursing staff also.  Yevonne Pax, MD [facility] Pulmonary Critical Care Medicine Sleep Medicine

## 2021-01-24 ENCOUNTER — Other Ambulatory Visit (HOSPITAL_COMMUNITY): Payer: Medicare Other

## 2021-01-24 DIAGNOSIS — K7030 Alcoholic cirrhosis of liver without ascites: Secondary | ICD-10-CM | POA: Diagnosis not present

## 2021-01-24 DIAGNOSIS — J9621 Acute and chronic respiratory failure with hypoxia: Secondary | ICD-10-CM | POA: Diagnosis not present

## 2021-01-24 DIAGNOSIS — I63219 Cerebral infarction due to unspecified occlusion or stenosis of unspecified vertebral arteries: Secondary | ICD-10-CM | POA: Diagnosis not present

## 2021-01-24 DIAGNOSIS — I482 Chronic atrial fibrillation, unspecified: Secondary | ICD-10-CM | POA: Diagnosis not present

## 2021-01-24 LAB — CBC
HCT: 32.3 % — ABNORMAL LOW (ref 39.0–52.0)
Hemoglobin: 11.3 g/dL — ABNORMAL LOW (ref 13.0–17.0)
MCH: 32.8 pg (ref 26.0–34.0)
MCHC: 35 g/dL (ref 30.0–36.0)
MCV: 93.9 fL (ref 80.0–100.0)
Platelets: 240 10*3/uL (ref 150–400)
RBC: 3.44 MIL/uL — ABNORMAL LOW (ref 4.22–5.81)
RDW: 13.2 % (ref 11.5–15.5)
WBC: 12.9 10*3/uL — ABNORMAL HIGH (ref 4.0–10.5)
nRBC: 0 % (ref 0.0–0.2)

## 2021-01-24 LAB — BASIC METABOLIC PANEL
Anion gap: 10 (ref 5–15)
BUN: 12 mg/dL (ref 8–23)
CO2: 25 mmol/L (ref 22–32)
Calcium: 8.4 mg/dL — ABNORMAL LOW (ref 8.9–10.3)
Chloride: 104 mmol/L (ref 98–111)
Creatinine, Ser: 0.71 mg/dL (ref 0.61–1.24)
GFR, Estimated: >60 mL/min (ref >60)
Glucose, Bld: 219 mg/dL — ABNORMAL HIGH (ref 70–99)
Potassium: 3.6 mmol/L (ref 3.5–5.1)
Sodium: 139 mmol/L (ref 135–145)

## 2021-01-24 NOTE — Progress Notes (Signed)
Pulmonary Critical Care Medicine [facility] [place]   PULMONARY CRITICAL CARE SERVICE  PROGRESS NOTE  Date of Service: 01/24/2021  Paul Chung  QMV:[id number]  DOB: 1956-03-24   DOA: 01/07/2021  Referring Physician: Carron Curie, MD  HPI: Paul Chung is a 65 y.o. male seen for follow up of Acute on Chronic Respiratory Failure.  Patient is on T collar has been off the ventilator for more than 48 hours  Medications: Reviewed on Rounds  Physical Exam:  Vitals: Temperature 96.9 pulse 71 respiratory rate 16 blood pressure is 112/62 saturations 100%  Ventilator Settings on T collar FiO2 is 28%  . General: Comfortable at this time . Eyes: Grossly normal lids, irises & conjunctiva . ENT: grossly tongue is normal . Neck: no obvious mass . Cardiovascular: S1 S2 normal no gallop . Respiratory: Scattered rhonchi coarse breath sounds . Abdomen: soft . Skin: no rash seen on limited exam . Musculoskeletal: not rigid . Psychiatric:unable to assess . Neurologic: no seizure no involuntary movements         Lab Data:   Basic Metabolic Panel: Recent Labs  Lab 01/18/21 1151 01/19/21 0428 01/20/21 1408 01/24/21 0505  NA 142 142  --  139  K 3.4* 3.4* 3.5 3.6  CL 107 106  --  104  CO2 26 24  --  25  GLUCOSE 172* 192*  --  219*  BUN 24* 21  --  12  CREATININE 0.70 0.71  --  0.71  CALCIUM 9.0 9.0  --  8.4*    ABG: No results for input(s): PHART, PCO2ART, PO2ART, HCO3, O2SAT in the last 168 hours.  Liver Function Tests: No results for input(s): AST, ALT, ALKPHOS, BILITOT, PROT, ALBUMIN in the last 168 hours. No results for input(s): LIPASE, AMYLASE in the last 168 hours. No results for input(s): AMMONIA in the last 168 hours.  CBC: Recent Labs  Lab 01/18/21 1151 01/19/21 0428 01/24/21 0505  WBC 19.9* 15.8* 12.9*  NEUTROABS 16.9*  --   --   HGB 12.3* 11.8* 11.3*  HCT 35.7* 34.4* 32.3*  MCV 94.7 95.8 93.9  PLT 372 313 240    Cardiac  Enzymes: No results for input(s): CKTOTAL, CKMB, CKMBINDEX, TROPONINI in the last 168 hours.  BNP (last 3 results) No results for input(s): BNP in the last 8760 hours.  ProBNP (last 3 results) No results for input(s): PROBNP in the last 8760 hours.  Radiological Exams: DG CHEST PORT 1 VIEW  Result Date: 01/24/2021 CLINICAL DATA:  Respiratory failure EXAM: PORTABLE CHEST 1 VIEW COMPARISON:  01/17/2021 FINDINGS: Low volume chest with mild atelectatic type opacity. No effusion or visible pneumothorax. Cardiomegaly. Left atrial appendage occluder. Tracheostomy tube in place. IMPRESSION: Stable cardiomegaly and mild atelectasis. Electronically Signed   By: Marnee Spring M.D.   On: 01/24/2021 06:27    Assessment/Plan Active Problems:   Acute on chronic respiratory failure with hypoxia (HCC)   Acute arterial ischemic stroke, vertebrobasilar, thalamic, unspecified laterality (HCC)   Chronic atrial fibrillation with RVR (HCC)   Alcoholic cirrhosis of liver without ascites (HCC)   1. Acute on chronic respiratory failure hypoxia we will continue with the T collar titrate oxygen continue pulmonary toilet. 2. Acute stroke no change 3. Chronic atrial fibrillation rate is controlled 4. 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 The Patient requires high complexity decision  making with multiple systems involvement.  Rounds were done with the Respiratory Therapy Director and Staff therapists and discussed with nursing staff also.  Allyne Gee, MD [facility] Pulmonary Critical Care Medicine Sleep Medicine

## 2021-01-25 DIAGNOSIS — I63219 Cerebral infarction due to unspecified occlusion or stenosis of unspecified vertebral arteries: Secondary | ICD-10-CM | POA: Diagnosis not present

## 2021-01-25 DIAGNOSIS — J9621 Acute and chronic respiratory failure with hypoxia: Secondary | ICD-10-CM | POA: Diagnosis not present

## 2021-01-25 DIAGNOSIS — I482 Chronic atrial fibrillation, unspecified: Secondary | ICD-10-CM | POA: Diagnosis not present

## 2021-01-25 DIAGNOSIS — K7030 Alcoholic cirrhosis of liver without ascites: Secondary | ICD-10-CM | POA: Diagnosis not present

## 2021-01-25 NOTE — Progress Notes (Signed)
Pulmonary Critical Care Medicine [facility] [place]   PULMONARY CRITICAL CARE SERVICE  PROGRESS NOTE  Date of Service: 01/25/2021  Paul Chung  YWV:[id number]  DOB: 08-Nov-1956   DOA: 01/07/2021  Referring Physician: Carron Curie, MD  HPI: Paul Chung is a 65 y.o. male seen for follow up of Acute on Chronic Respiratory Failure.  Patient is currently T collar has been on 28% FiO2 did past the PMV and did okay  Medications: Reviewed on Rounds  Physical Exam:  Vitals: Temperature 98.1 pulse 69 respiratory 18 blood pressure is 146/81 saturations 100%  Ventilator Settings on T collar FiO2 28%  . General: Comfortable at this time . Eyes: Grossly normal lids, irises & conjunctiva . ENT: grossly tongue is normal . Neck: no obvious mass . Cardiovascular: S1 S2 normal no gallop . Respiratory: No rhonchi very coarse breath sounds . Abdomen: soft . Skin: no rash seen on limited exam . Musculoskeletal: not rigid . Psychiatric:unable to assess . Neurologic: no seizure no involuntary movements         Lab Data:   Basic Metabolic Panel: Recent Labs  Lab 01/18/21 1151 01/19/21 0428 01/20/21 1408 01/24/21 0505  NA 142 142  --  139  K 3.4* 3.4* 3.5 3.6  CL 107 106  --  104  CO2 26 24  --  25  GLUCOSE 172* 192*  --  219*  BUN 24* 21  --  12  CREATININE 0.70 0.71  --  0.71  CALCIUM 9.0 9.0  --  8.4*    ABG: No results for input(s): PHART, PCO2ART, PO2ART, HCO3, O2SAT in the last 168 hours.  Liver Function Tests: No results for input(s): AST, ALT, ALKPHOS, BILITOT, PROT, ALBUMIN in the last 168 hours. No results for input(s): LIPASE, AMYLASE in the last 168 hours. No results for input(s): AMMONIA in the last 168 hours.  CBC: Recent Labs  Lab 01/18/21 1151 01/19/21 0428 01/24/21 0505  WBC 19.9* 15.8* 12.9*  NEUTROABS 16.9*  --   --   HGB 12.3* 11.8* 11.3*  HCT 35.7* 34.4* 32.3*  MCV 94.7 95.8 93.9  PLT 372 313 240    Cardiac  Enzymes: No results for input(s): CKTOTAL, CKMB, CKMBINDEX, TROPONINI in the last 168 hours.  BNP (last 3 results) No results for input(s): BNP in the last 8760 hours.  ProBNP (last 3 results) No results for input(s): PROBNP in the last 8760 hours.  Radiological Exams: DG CHEST PORT 1 VIEW  Result Date: 01/24/2021 CLINICAL DATA:  Respiratory failure EXAM: PORTABLE CHEST 1 VIEW COMPARISON:  01/17/2021 FINDINGS: Low volume chest with mild atelectatic type opacity. No effusion or visible pneumothorax. Cardiomegaly. Left atrial appendage occluder. Tracheostomy tube in place. IMPRESSION: Stable cardiomegaly and mild atelectasis. Electronically Signed   By: Marnee Spring M.D.   On: 01/24/2021 06:27    Assessment/Plan Active Problems:   Acute on chronic respiratory failure with hypoxia (HCC)   Acute arterial ischemic stroke, vertebrobasilar, thalamic, unspecified laterality (HCC)   Chronic atrial fibrillation with RVR (HCC)   Alcoholic cirrhosis of liver without ascites (HCC)   1. Acute on chronic respiratory failure hypoxia we will continue with the T-piece titrate oxygen continue pulmonary toilet. 2. Acute stroke no change 3. Chronic atrial fibrillation rate controlled 4. 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 The Patient requires high complexity decision making with multiple systems involvement.  Rounds were done with the Respiratory Therapy Director and Staff therapists and discussed with nursing staff also.  Allyne Gee, MD [facility] Pulmonary Critical Care Medicine Sleep Medicine

## 2021-01-26 DIAGNOSIS — K7030 Alcoholic cirrhosis of liver without ascites: Secondary | ICD-10-CM | POA: Diagnosis not present

## 2021-01-26 DIAGNOSIS — I63219 Cerebral infarction due to unspecified occlusion or stenosis of unspecified vertebral arteries: Secondary | ICD-10-CM | POA: Diagnosis not present

## 2021-01-26 DIAGNOSIS — I482 Chronic atrial fibrillation, unspecified: Secondary | ICD-10-CM | POA: Diagnosis not present

## 2021-01-26 DIAGNOSIS — J9621 Acute and chronic respiratory failure with hypoxia: Secondary | ICD-10-CM | POA: Diagnosis not present

## 2021-01-26 NOTE — Progress Notes (Signed)
Pulmonary Critical Care Medicine [facility] [place]   PULMONARY CRITICAL CARE SERVICE  PROGRESS NOTE  Date of Service: 01/26/2021  Paul Chung  [id number]  DOB: 1956/12/11   DOA: 01/07/2021  Referring Physician: Carron Curie, MD  HPI: Paul Chung is a 65 y.o. male seen for follow up of Acute on Chronic Respiratory Failure.  Patient currently is on T collar has been on 28% FiO2 secretions are moderate  Medications: Reviewed on Rounds  Physical Exam:  Vitals: Temperature is 96.7 pulse 55 respiratory rate is 16 blood pressure is 128/68 saturations 100%  Ventilator Settings on T collar with an FiO2 of 28%  . General: Comfortable at this time . Eyes: Grossly normal lids, irises & conjunctiva . ENT: grossly tongue is normal . Neck: no obvious mass . Cardiovascular: S1 S2 normal no gallop . Respiratory: Scattered rhonchi expansion is equal . Abdomen: soft . Skin: no rash seen on limited exam . Musculoskeletal: not rigid . Psychiatric:unable to assess . Neurologic: no seizure no involuntary movements         Lab Data:   Basic Metabolic Panel: Recent Labs  Lab 01/20/21 1408 01/24/21 0505  NA  --  139  K 3.5 3.6  CL  --  104  CO2  --  25  GLUCOSE  --  219*  BUN  --  12  CREATININE  --  0.71  CALCIUM  --  8.4*    ABG: No results for input(s): PHART, PCO2ART, PO2ART, HCO3, O2SAT in the last 168 hours.  Liver Function Tests: No results for input(s): AST, ALT, ALKPHOS, BILITOT, PROT, ALBUMIN in the last 168 hours. No results for input(s): LIPASE, AMYLASE in the last 168 hours. No results for input(s): AMMONIA in the last 168 hours.  CBC: Recent Labs  Lab 01/24/21 0505  WBC 12.9*  HGB 11.3*  HCT 32.3*  MCV 93.9  PLT 240    Cardiac Enzymes: No results for input(s): CKTOTAL, CKMB, CKMBINDEX, TROPONINI in the last 168 hours.  BNP (last 3 results) No results for input(s): BNP in the last 8760 hours.  ProBNP (last 3  results) No results for input(s): PROBNP in the last 8760 hours.  Radiological Exams: No results found.  Assessment/Plan Active Problems:   Acute on chronic respiratory failure with hypoxia (HCC)   Acute arterial ischemic stroke, vertebrobasilar, thalamic, unspecified laterality (HCC)   Chronic atrial fibrillation with RVR (HCC)   Alcoholic cirrhosis of liver without ascites (HCC)   1. Acute on chronic respiratory failure hypoxia we will continue with the T-piece patient secretions are limiting factor. 2. Acute stroke no change patient remains comfortable. 3. Chronic atrial fibrillation rate is controlled 4. 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 The Patient requires high complexity decision making with multiple systems involvement.  Rounds were done with the Respiratory Therapy Director and Staff therapists and discussed with nursing staff also.  Yevonne Pax, MD [facility] Pulmonary Critical Care Medicine Sleep Medicine

## 2021-01-27 DIAGNOSIS — J9621 Acute and chronic respiratory failure with hypoxia: Secondary | ICD-10-CM | POA: Diagnosis not present

## 2021-01-27 DIAGNOSIS — I63219 Cerebral infarction due to unspecified occlusion or stenosis of unspecified vertebral arteries: Secondary | ICD-10-CM | POA: Diagnosis not present

## 2021-01-27 DIAGNOSIS — K7030 Alcoholic cirrhosis of liver without ascites: Secondary | ICD-10-CM | POA: Diagnosis not present

## 2021-01-27 DIAGNOSIS — I482 Chronic atrial fibrillation, unspecified: Secondary | ICD-10-CM | POA: Diagnosis not present

## 2021-01-27 LAB — BASIC METABOLIC PANEL
Anion gap: 11 (ref 5–15)
BUN: 17 mg/dL (ref 8–23)
CO2: 23 mmol/L (ref 22–32)
Calcium: 8.6 mg/dL — ABNORMAL LOW (ref 8.9–10.3)
Chloride: 106 mmol/L (ref 98–111)
Creatinine, Ser: 0.64 mg/dL (ref 0.61–1.24)
GFR, Estimated: >60 mL/min (ref >60)
Glucose, Bld: 166 mg/dL — ABNORMAL HIGH (ref 70–99)
Potassium: 3.3 mmol/L — ABNORMAL LOW (ref 3.5–5.1)
Sodium: 140 mmol/L (ref 135–145)

## 2021-01-27 LAB — CBC
HCT: 33.7 % — ABNORMAL LOW (ref 39.0–52.0)
Hemoglobin: 12.1 g/dL — ABNORMAL LOW (ref 13.0–17.0)
MCH: 33.3 pg (ref 26.0–34.0)
MCHC: 35.9 g/dL (ref 30.0–36.0)
MCV: 92.8 fL (ref 80.0–100.0)
Platelets: 242 10*3/uL (ref 150–400)
RBC: 3.63 MIL/uL — ABNORMAL LOW (ref 4.22–5.81)
RDW: 13.9 % (ref 11.5–15.5)
WBC: 9 10*3/uL (ref 4.0–10.5)
nRBC: 0 % (ref 0.0–0.2)

## 2021-01-27 LAB — MAGNESIUM: Magnesium: 1.6 mg/dL — ABNORMAL LOW (ref 1.7–2.4)

## 2021-01-27 NOTE — Progress Notes (Signed)
Pulmonary Critical Care Medicine [facility] [place]   PULMONARY CRITICAL CARE SERVICE  PROGRESS NOTE  Date of Service: 01/27/2021  Paul Chung  GLO:[id number]  DOB: 06/30/1956   DOA: 01/07/2021  Referring Physician: Carron Curie, MD  HPI: Paul Chung is a 65 y.o. male seen for follow up of Acute on Chronic Respiratory Failure.  Patient currently is on T collar has been on 21% using the PMV  Medications: Reviewed on Rounds  Physical Exam:  Vitals: Temperature is 97.8 pulse 104 respiratory rate 32 blood pressure is 156/76 saturations 99%  Ventilator Settings on T collar PMV  . General: Comfortable at this time . Eyes: Grossly normal lids, irises & conjunctiva . ENT: grossly tongue is normal . Neck: no obvious mass . Cardiovascular: S1 S2 normal no gallop . Respiratory: No rhonchi no rales . Abdomen: soft . Skin: no rash seen on limited exam . Musculoskeletal: not rigid . Psychiatric:unable to assess . Neurologic: no seizure no involuntary movements         Lab Data:   Basic Metabolic Panel: Recent Labs  Lab 01/20/21 1408 01/24/21 0505 01/27/21 0351  NA  --  139 140  K 3.5 3.6 3.3*  CL  --  104 106  CO2  --  25 23  GLUCOSE  --  219* 166*  BUN  --  12 17  CREATININE  --  0.71 0.64  CALCIUM  --  8.4* 8.6*  MG  --   --  1.6*    ABG: No results for input(s): PHART, PCO2ART, PO2ART, HCO3, O2SAT in the last 168 hours.  Liver Function Tests: No results for input(s): AST, ALT, ALKPHOS, BILITOT, PROT, ALBUMIN in the last 168 hours. No results for input(s): LIPASE, AMYLASE in the last 168 hours. No results for input(s): AMMONIA in the last 168 hours.  CBC: Recent Labs  Lab 01/24/21 0505 01/27/21 0351  WBC 12.9* 9.0  HGB 11.3* 12.1*  HCT 32.3* 33.7*  MCV 93.9 92.8  PLT 240 242    Cardiac Enzymes: No results for input(s): CKTOTAL, CKMB, CKMBINDEX, TROPONINI in the last 168 hours.  BNP (last 3 results) No results for  input(s): BNP in the last 8760 hours.  ProBNP (last 3 results) No results for input(s): PROBNP in the last 8760 hours.  Radiological Exams: No results found.  Assessment/Plan Active Problems:   Acute on chronic respiratory failure with hypoxia (HCC)   Acute arterial ischemic stroke, vertebrobasilar, thalamic, unspecified laterality (HCC)   Chronic atrial fibrillation with RVR (HCC)   Alcoholic cirrhosis of liver without ascites (HCC)   1. Acute on chronic respiratory failure hypoxia plan is to advance the weaning to capping now 2. Acute stroke no change we will continue with supportive care 3. Chronic atrial fibrillation rate is controlled 4. 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 The Patient requires high complexity decision making with multiple systems involvement.  Rounds were done with the Respiratory Therapy Director and Staff therapists and discussed with nursing staff also.  Yevonne Pax, MD [facility] Pulmonary Critical Care Medicine Sleep Medicine

## 2021-01-28 DIAGNOSIS — K7030 Alcoholic cirrhosis of liver without ascites: Secondary | ICD-10-CM | POA: Diagnosis not present

## 2021-01-28 DIAGNOSIS — I63219 Cerebral infarction due to unspecified occlusion or stenosis of unspecified vertebral arteries: Secondary | ICD-10-CM | POA: Diagnosis not present

## 2021-01-28 DIAGNOSIS — J9621 Acute and chronic respiratory failure with hypoxia: Secondary | ICD-10-CM | POA: Diagnosis not present

## 2021-01-28 DIAGNOSIS — I482 Chronic atrial fibrillation, unspecified: Secondary | ICD-10-CM | POA: Diagnosis not present

## 2021-01-28 LAB — MAGNESIUM: Magnesium: 2 mg/dL (ref 1.7–2.4)

## 2021-01-28 LAB — POTASSIUM: Potassium: 3.7 mmol/L (ref 3.5–5.1)

## 2021-01-28 NOTE — Progress Notes (Signed)
Pulmonary Critical Care Medicine [facility] [place]   PULMONARY CRITICAL CARE SERVICE  PROGRESS NOTE  Date of Service: 01/28/2021  Paul Chung  [id number]  DOB: 1956-04-05   DOA: 01/07/2021  Referring Physician: Carron Curie, MD  HPI: Paul Chung is a 65 y.o. male seen for follow up of Acute on Chronic Respiratory Failure.  Patient currently is capping has been on room air  Medications: Reviewed on Rounds  Physical Exam:  Vitals: Temperature is 97.0 pulse 61 respiratory 12 blood pressure is 139/76 saturations 100%  Ventilator Settings capping right now on room air  . General: Comfortable at this time . Eyes: Grossly normal lids, irises & conjunctiva . ENT: grossly tongue is normal . Neck: no obvious mass . Cardiovascular: S1 S2 normal no gallop . Respiratory: Scattered rhonchi expansion equal . Abdomen: soft . Skin: no rash seen on limited exam . Musculoskeletal: not rigid . Psychiatric:unable to assess . Neurologic: no seizure no involuntary movements         Lab Data:   Basic Metabolic Panel: Recent Labs  Lab 01/24/21 0505 01/27/21 0351 01/28/21 0525  NA 139 140  --   K 3.6 3.3* 3.7  CL 104 106  --   CO2 25 23  --   GLUCOSE 219* 166*  --   BUN 12 17  --   CREATININE 0.71 0.64  --   CALCIUM 8.4* 8.6*  --   MG  --  1.6* 2.0    ABG: No results for input(s): PHART, PCO2ART, PO2ART, HCO3, O2SAT in the last 168 hours.  Liver Function Tests: No results for input(s): AST, ALT, ALKPHOS, BILITOT, PROT, ALBUMIN in the last 168 hours. No results for input(s): LIPASE, AMYLASE in the last 168 hours. No results for input(s): AMMONIA in the last 168 hours.  CBC: Recent Labs  Lab 01/24/21 0505 01/27/21 0351  WBC 12.9* 9.0  HGB 11.3* 12.1*  HCT 32.3* 33.7*  MCV 93.9 92.8  PLT 240 242    Cardiac Enzymes: No results for input(s): CKTOTAL, CKMB, CKMBINDEX, TROPONINI in the last 168 hours.  BNP (last 3 results) No results  for input(s): BNP in the last 8760 hours.  ProBNP (last 3 results) No results for input(s): PROBNP in the last 8760 hours.  Radiological Exams: No results found.  Assessment/Plan Active Problems:   Acute on chronic respiratory failure with hypoxia (HCC)   Acute arterial ischemic stroke, vertebrobasilar, thalamic, unspecified laterality (HCC)   Chronic atrial fibrillation with RVR (HCC)   Alcoholic cirrhosis of liver without ascites (HCC)   1. Acute on chronic respiratory failure hypoxia we will continue with capping trials titrate oxygen continue pulmonary toilet 2. Acute stroke no change supportive care 3. Chronic atrial fibrillation rate is controlled 4. 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 The Patient requires high complexity decision making with multiple systems involvement.  Rounds were done with the Respiratory Therapy Director and Staff therapists and discussed with nursing staff also.  Yevonne Pax, MD [facility] Pulmonary Critical Care Medicine Sleep Medicine

## 2021-01-29 DIAGNOSIS — K7030 Alcoholic cirrhosis of liver without ascites: Secondary | ICD-10-CM | POA: Diagnosis not present

## 2021-01-29 DIAGNOSIS — J9621 Acute and chronic respiratory failure with hypoxia: Secondary | ICD-10-CM | POA: Diagnosis not present

## 2021-01-29 DIAGNOSIS — I63219 Cerebral infarction due to unspecified occlusion or stenosis of unspecified vertebral arteries: Secondary | ICD-10-CM | POA: Diagnosis not present

## 2021-01-29 DIAGNOSIS — I482 Chronic atrial fibrillation, unspecified: Secondary | ICD-10-CM | POA: Diagnosis not present

## 2021-01-29 NOTE — Progress Notes (Signed)
Pulmonary Critical Care Medicine [facility] [place]   PULMONARY CRITICAL CARE SERVICE  PROGRESS NOTE  Date of Service: 01/29/2021  [name]  YDX:[id number]  DOB: January 18, 1956   DOA: 01/07/2021  Referring Physician: Carron Curie, MD  HPI: Paul Chung is a 65 y.o. male seen for follow up of Acute on Chronic Respiratory Failure.  Patient currently is capping his been on room air goal is 24 hours today  Medications: Reviewed on Rounds  Physical Exam:  Vitals: Temperature is 96.3 pulse 65 respiratory rate is 14 blood pressure is 139/84 saturations 94%  Ventilator Settings capping on room air  . General: Comfortable at this time . Eyes: Grossly normal lids, irises & conjunctiva . ENT: grossly tongue is normal . Neck: no obvious mass . Cardiovascular: S1 S2 normal no gallop . Respiratory: No rhonchi coarse breath sound . Abdomen: soft . Skin: no rash seen on limited exam . Musculoskeletal: not rigid . Psychiatric:unable to assess . Neurologic: no seizure no involuntary movements         Lab Data:   Basic Metabolic Panel: Recent Labs  Lab 01/24/21 0505 01/27/21 0351 01/28/21 0525  NA 139 140  --   K 3.6 3.3* 3.7  CL 104 106  --   CO2 25 23  --   GLUCOSE 219* 166*  --   BUN 12 17  --   CREATININE 0.71 0.64  --   CALCIUM 8.4* 8.6*  --   MG  --  1.6* 2.0    ABG: No results for input(s): PHART, PCO2ART, PO2ART, HCO3, O2SAT in the last 168 hours.  Liver Function Tests: No results for input(s): AST, ALT, ALKPHOS, BILITOT, PROT, ALBUMIN in the last 168 hours. No results for input(s): LIPASE, AMYLASE in the last 168 hours. No results for input(s): AMMONIA in the last 168 hours.  CBC: Recent Labs  Lab 01/24/21 0505 01/27/21 0351  WBC 12.9* 9.0  HGB 11.3* 12.1*  HCT 32.3* 33.7*  MCV 93.9 92.8  PLT 240 242    Cardiac Enzymes: No results for input(s): CKTOTAL, CKMB, CKMBINDEX, TROPONINI in the last 168 hours.  BNP (last 3  results) No results for input(s): BNP in the last 8760 hours.  ProBNP (last 3 results) No results for input(s): PROBNP in the last 8760 hours.  Radiological Exams: No results found.  Assessment/Plan Active Problems:   Acute on chronic respiratory failure with hypoxia (HCC)   Acute arterial ischemic stroke, vertebrobasilar, thalamic, unspecified laterality (HCC)   Chronic atrial fibrillation with RVR (HCC)   Alcoholic cirrhosis of liver without ascites (HCC)   1. Acute on chronic respiratory failure hypoxia we will continue with the capping trials titrate as tolerated continue pulmonary toilet. 2. Chronic atrial fibrillation rate is controlled continue to monitor. 3. Acute stroke no change 4. 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 The Patient requires high complexity decision making with multiple systems involvement.  Rounds were done with the Respiratory Therapy Director and Staff therapists and discussed with nursing staff also.  Yevonne Pax, MD [facility] Pulmonary Critical Care Medicine Sleep Medicine

## 2021-01-31 DIAGNOSIS — I482 Chronic atrial fibrillation, unspecified: Secondary | ICD-10-CM | POA: Diagnosis not present

## 2021-01-31 DIAGNOSIS — J9621 Acute and chronic respiratory failure with hypoxia: Secondary | ICD-10-CM | POA: Diagnosis not present

## 2021-01-31 DIAGNOSIS — K7030 Alcoholic cirrhosis of liver without ascites: Secondary | ICD-10-CM | POA: Diagnosis not present

## 2021-01-31 DIAGNOSIS — I63219 Cerebral infarction due to unspecified occlusion or stenosis of unspecified vertebral arteries: Secondary | ICD-10-CM | POA: Diagnosis not present

## 2021-01-31 NOTE — Progress Notes (Signed)
Pulmonary Critical Care Medicine [facility] [place]   PULMONARY CRITICAL CARE SERVICE  PROGRESS NOTE  Date of Service: 01/31/2021  Bryant Saye  VOZ:[id number]  DOB: 1956-05-03   DOA: 01/07/2021  Referring Physician: Carron Curie, MD  HPI: Cutberto Winfree is a 65 y.o. male seen for follow up of Acute on Chronic Respiratory Failure.  Patient currently is capping today is the third day ready for decannulation  Medications: Reviewed on Rounds  Physical Exam:  Vitals: Temperature is 96.6 pulse 65 respiratory rate is 15 blood pressure is 155/85 saturations 97%  Ventilator Settings capping off the ventilator  . General: Comfortable at this time . Eyes: Grossly normal lids, irises & conjunctiva . ENT: grossly tongue is normal . Neck: no obvious mass . Cardiovascular: S1 S2 normal no gallop . Respiratory: Scattered rhonchi expansion is equal . Abdomen: soft . Skin: no rash seen on limited exam . Musculoskeletal: not rigid . Psychiatric:unable to assess . Neurologic: no seizure no involuntary movements         Lab Data:   Basic Metabolic Panel: Recent Labs  Lab 01/27/21 0351 01/28/21 0525  NA 140  --   K 3.3* 3.7  CL 106  --   CO2 23  --   GLUCOSE 166*  --   BUN 17  --   CREATININE 0.64  --   CALCIUM 8.6*  --   MG 1.6* 2.0    ABG: No results for input(s): PHART, PCO2ART, PO2ART, HCO3, O2SAT in the last 168 hours.  Liver Function Tests: No results for input(s): AST, ALT, ALKPHOS, BILITOT, PROT, ALBUMIN in the last 168 hours. No results for input(s): LIPASE, AMYLASE in the last 168 hours. No results for input(s): AMMONIA in the last 168 hours.  CBC: Recent Labs  Lab 01/27/21 0351  WBC 9.0  HGB 12.1*  HCT 33.7*  MCV 92.8  PLT 242    Cardiac Enzymes: No results for input(s): CKTOTAL, CKMB, CKMBINDEX, TROPONINI in the last 168 hours.  BNP (last 3 results) No results for input(s): BNP in the last 8760 hours.  ProBNP (last 3  results) No results for input(s): PROBNP in the last 8760 hours.  Radiological Exams: No results found.  Assessment/Plan Active Problems:   Acute on chronic respiratory failure with hypoxia (HCC)   Acute arterial ischemic stroke, vertebrobasilar, thalamic, unspecified laterality (HCC)   Chronic atrial fibrillation with RVR (HCC)   Alcoholic cirrhosis of liver without ascites (HCC)   1. Acute on chronic respiratory failure hypoxia we will continue with the weaning process advanced to decannulation at this time. 2. Acute stroke we will continue to follow along. 3. Chronic atrial fibrillation rate is controlled 4. Alcohol cirrhosis no change we will continue to monitor closely   I have personally seen and evaluated the patient, evaluated laboratory and imaging results, formulated the assessment and plan and placed orders. The Patient requires high complexity decision making with multiple systems involvement.  Rounds were done with the Respiratory Therapy Director and Staff therapists and discussed with nursing staff also.  Yevonne Pax, MD [facility] Pulmonary Critical Care Medicine Sleep Medicine

## 2021-02-01 LAB — CBC
HCT: 39.6 % (ref 39.0–52.0)
Hemoglobin: 14.1 g/dL (ref 13.0–17.0)
MCH: 33.4 pg (ref 26.0–34.0)
MCHC: 35.6 g/dL (ref 30.0–36.0)
MCV: 93.8 fL (ref 80.0–100.0)
Platelets: 222 10*3/uL (ref 150–400)
RBC: 4.22 MIL/uL (ref 4.22–5.81)
RDW: 16.5 % — ABNORMAL HIGH (ref 11.5–15.5)
WBC: 10.8 10*3/uL — ABNORMAL HIGH (ref 4.0–10.5)
nRBC: 0 % (ref 0.0–0.2)

## 2021-02-01 LAB — BASIC METABOLIC PANEL
Anion gap: 11 (ref 5–15)
BUN: 22 mg/dL (ref 8–23)
CO2: 23 mmol/L (ref 22–32)
Calcium: 9.1 mg/dL (ref 8.9–10.3)
Chloride: 102 mmol/L (ref 98–111)
Creatinine, Ser: 0.59 mg/dL — ABNORMAL LOW (ref 0.61–1.24)
GFR, Estimated: >60 mL/min (ref >60)
Glucose, Bld: 166 mg/dL — ABNORMAL HIGH (ref 70–99)
Potassium: 3.8 mmol/L (ref 3.5–5.1)
Sodium: 136 mmol/L (ref 135–145)

## 2021-02-02 LAB — SARS CORONAVIRUS 2 (TAT 6-24 HRS): SARS Coronavirus 2: NEGATIVE

## 2021-02-17 IMAGING — DX DG CHEST 1V PORT
1 series · 1 of 1 positions shown · non-contrast
Comparison: 01/17/2021

CLINICAL DATA: Respiratory failure

EXAM:
PORTABLE CHEST 1 VIEW

[chest ap]
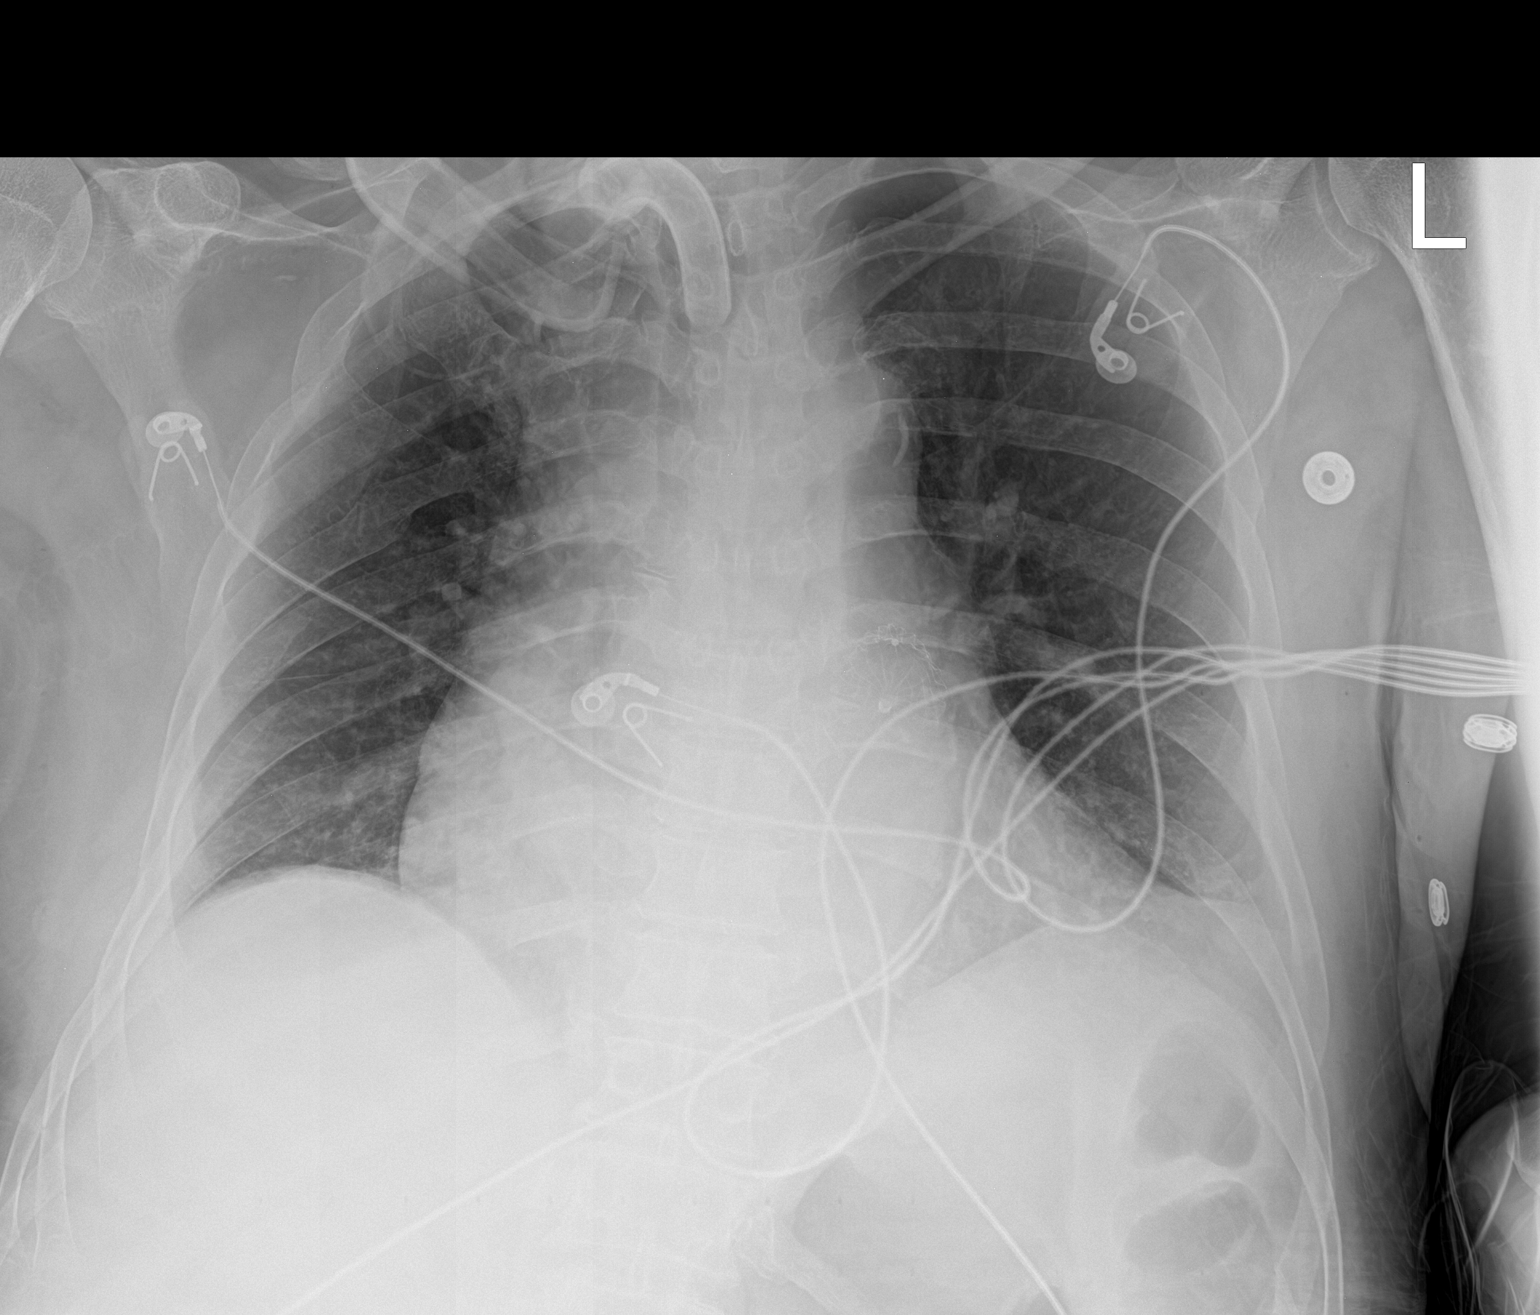

[1 of 1 positions shown; findings below may reference images not displayed]

FINDINGS: Low volume chest with mild atelectatic type opacity. No effusion or
visible pneumothorax. Cardiomegaly. Left atrial appendage occluder.
Tracheostomy tube in place.
IMPRESSION: Stable cardiomegaly and mild atelectasis.
